# Patient Record
Sex: Male | Born: 1975
Health system: Southern US, Community
[De-identification: ages and names within clinical notes are randomized; demographics above are authoritative.]

## PROBLEM LIST (undated history)

## (undated) DIAGNOSIS — N529 Male erectile dysfunction, unspecified: Secondary | ICD-10-CM

## (undated) DIAGNOSIS — E291 Testicular hypofunction: Secondary | ICD-10-CM

## (undated) DIAGNOSIS — G4733 Obstructive sleep apnea (adult) (pediatric): Secondary | ICD-10-CM

## (undated) DIAGNOSIS — E88819 Insulin resistance, unspecified: Secondary | ICD-10-CM

## (undated) DIAGNOSIS — E8881 Metabolic syndrome: Secondary | ICD-10-CM

## (undated) DIAGNOSIS — I10 Essential (primary) hypertension: Secondary | ICD-10-CM

## (undated) DIAGNOSIS — L219 Seborrheic dermatitis, unspecified: Secondary | ICD-10-CM

## (undated) HISTORY — DX: Male erectile dysfunction, unspecified: N52.9

## (undated) HISTORY — DX: Insulin resistance, unspecified: E88.819

## (undated) HISTORY — DX: Morbid (severe) obesity due to excess calories: E66.01

## (undated) HISTORY — DX: Seborrheic dermatitis, unspecified: L21.9

## (undated) HISTORY — DX: Metabolic syndrome: E88.81

## (undated) HISTORY — DX: Essential (primary) hypertension: I10

## (undated) HISTORY — DX: Obstructive sleep apnea (adult) (pediatric): G47.33

## (undated) HISTORY — DX: Testicular hypofunction: E29.1

---

## 1999-01-28 HISTORY — PX: VASECTOMY: SHX75

## 1999-01-28 HISTORY — PX: TONSILLECTOMY: SUR1361

## 2003-01-28 HISTORY — PX: HERNIA REPAIR: SHX51

## 2008-08-21 ENCOUNTER — Emergency Department (HOSPITAL_COMMUNITY): Admission: EM | Admit: 2008-08-21 | Discharge: 2008-08-21 | Payer: Self-pay | Admitting: Emergency Medicine

## 2008-10-21 ENCOUNTER — Ambulatory Visit: Payer: Self-pay | Admitting: Occupational Medicine

## 2008-10-21 DIAGNOSIS — L738 Other specified follicular disorders: Secondary | ICD-10-CM | POA: Insufficient documentation

## 2008-10-21 DIAGNOSIS — I10 Essential (primary) hypertension: Secondary | ICD-10-CM

## 2009-02-13 ENCOUNTER — Ambulatory Visit: Payer: Self-pay | Admitting: Family Medicine

## 2009-03-26 ENCOUNTER — Ambulatory Visit (HOSPITAL_COMMUNITY): Admission: RE | Admit: 2009-03-26 | Discharge: 2009-03-26 | Payer: Self-pay | Admitting: Surgery

## 2009-03-30 ENCOUNTER — Ambulatory Visit (HOSPITAL_COMMUNITY): Admission: RE | Admit: 2009-03-30 | Discharge: 2009-03-30 | Payer: Self-pay | Admitting: Surgery

## 2009-05-07 ENCOUNTER — Encounter: Admission: RE | Admit: 2009-05-07 | Discharge: 2009-05-07 | Payer: Self-pay | Admitting: Surgery

## 2010-01-27 HISTORY — PX: LAPAROSCOPIC GASTRIC BANDING: SHX1100

## 2010-02-17 ENCOUNTER — Encounter: Payer: Self-pay | Admitting: Surgery

## 2010-02-26 NOTE — Assessment & Plan Note (Signed)
Summary: WGT MGT CLASS W/DR Aureliano Oshields/[facility]   Allergies: 1)  ! * Cats   Complete Medication List: 1)  Hyzaar 50-12.5 Mg Tabs (Losartan potassium-hctz) 2)  Keflex 500 Mg Caps (Cephalexin) .... One tablet three times a day for skin infection

## 2010-03-06 ENCOUNTER — Encounter: Payer: Self-pay | Admitting: *Deleted

## 2010-03-12 ENCOUNTER — Ambulatory Visit (HOSPITAL_BASED_OUTPATIENT_CLINIC_OR_DEPARTMENT_OTHER): Payer: 59 | Attending: Internal Medicine

## 2010-03-12 DIAGNOSIS — G4710 Hypersomnia, unspecified: Secondary | ICD-10-CM | POA: Insufficient documentation

## 2010-03-16 DIAGNOSIS — G4710 Hypersomnia, unspecified: Secondary | ICD-10-CM

## 2010-03-16 DIAGNOSIS — G4730 Sleep apnea, unspecified: Secondary | ICD-10-CM

## 2010-04-09 ENCOUNTER — Encounter: Payer: Self-pay | Admitting: Emergency Medicine

## 2010-04-09 ENCOUNTER — Ambulatory Visit (INDEPENDENT_AMBULATORY_CARE_PROVIDER_SITE_OTHER): Payer: 59 | Admitting: Emergency Medicine

## 2010-04-09 DIAGNOSIS — R599 Enlarged lymph nodes, unspecified: Secondary | ICD-10-CM | POA: Insufficient documentation

## 2010-04-16 NOTE — Assessment & Plan Note (Signed)
Summary: LUMP UNDER CHIN/TJ rm 4   Vital Signs:  Hector Tucker Profile:   35 Years Old Male CC:      lump under chin Height:     72 inches Weight:      340 pounds O2 Sat:      95 % O2 treatment:    Room Air Temp:     98.9 degrees F oral Pulse rate:   75 / minute Resp:     18 per minute BP sitting:   130 / 85  (left arm) Cuff size:   large  Vitals Entered By: Clemens Catholic LPN (April 09, 2010 5:11 PM)                  Updated Prior Medication List: HYZAAR 50-12.5 MG TABS (LOSARTAN POTASSIUM-HCTZ)   Current Allergies (reviewed today): ! ERYTHROMYCIN ! * CATSHistory of Present Illness History from: Hector Tucker Chief Complaint: lump under chin History of Present Illness: Lump under chin for 2 days.  May have cut himself on his razor a few days ago.  It is painful and swollen. No trouble breathing, no SOB.  No F/C, no URI symptoms.  No other concerns.  He is a Engineer, civil (consulting) at [employer].  REVIEW OF SYSTEMS Constitutional Symptoms      Denies fever, chills, night sweats, weight loss, weight gain, and fatigue.  Eyes       Denies change in vision, eye pain, eye discharge, glasses, contact lenses, and eye surgery. Ear/Nose/Throat/Mouth       Denies hearing loss/aids, change in hearing, ear pain, ear discharge, dizziness, frequent runny nose, frequent nose bleeds, sinus problems, sore throat, hoarseness, and tooth pain or bleeding.  Respiratory       Denies dry cough, productive cough, wheezing, shortness of breath, asthma, bronchitis, and emphysema/COPD.  Cardiovascular       Denies murmurs, chest pain, and tires easily with exhertion.    Gastrointestinal       Denies stomach pain, nausea/vomiting, diarrhea, constipation, blood in bowel movements, and indigestion. Genitourniary       Denies painful urination, kidney stones, and loss of urinary control. Neurological       Denies paralysis, seizures, and fainting/blackouts. Musculoskeletal       Denies muscle pain, joint pain, joint stiffness,  decreased range of motion, redness, swelling, muscle weakness, and gout.  Skin       Denies bruising, unusual mles/lumps or sores, and hair/skin or nail changes.  Psych       Denies mood changes, temper/anger issues, anxiety/stress, speech problems, depression, and sleep problems. Other Comments: pt states that he has had a painful lump under his chin for 2 days, worse today. he has not taken any OTC meds. no fever.   Past History:  Past Medical History: Reviewed history from 10/21/2008 and no changes required. Hypertension  Past Surgical History: Reviewed history from 10/21/2008 and no changes required. Tonsillectomy Vasectomy  Family History: Reviewed history from 10/21/2008 and no changes required. Family History Hypertension  Social History: Reviewed history from 10/21/2008 and no changes required. Occupation:Nurse Married Never Smoked Alcohol use-no Drug use-no Physical Exam General appearance: well developed, well nourished, no acute distress MSE: oriented to time, place, and person Submandibular tender lymphadenopathy.  No trauma or lacerations seen.  OP is clear and patent.  No apparent dental infections of sublingual swelling.  May also have nontender mild anterior cervical LAD as well.   Assessment New Problems: ENLARGEMENT OF LYMPH NODES (ICD-785.6)   Plan New Medications/Changes: AMOXICILLIN  875 MG TABS (AMOXICILLIN) 1 by mouth two times a day for 7 days  #14 x 0, 04/09/2010, Hoyt Koch MD  New Orders: Est. Hector Tucker Level III [phone] Planning Comments:   I think this is swelling of the submandibular, possibly from a razor cut and the lymph node is currently clearing the infection.  I doubt this is an abscess. OP is widely patent. No fever.  Will give Rx for Amox for 7 days just in case.  If not improving quickly, or if worsening, would get CBC and possibly an ultrasound and change the antibiotics for better coverage (Clindamycin vs Doxy). We will call  the Hector Tucker in 2 days for follow up.    The Hector Tucker and/or caregiver has been counseled thoroughly with regard to medications prescribed including dosage, schedule, interactions, rationale for use, and possible side effects and they verbalize understanding.  Diagnoses and expected course of recovery discussed and will return if not improved as expected or if the condition worsens. Hector Tucker and/or caregiver verbalized understanding.  Prescriptions: AMOXICILLIN 875 MG TABS (AMOXICILLIN) 1 by mouth two times a day for 7 days  #14 x 0   Entered and Authorized by:   Hoyt Koch MD   Signed by:   Hoyt Koch MD on 04/09/2010   Method used:   Print then Give to Hector Tucker   RxID:   [id number]   Orders Added: 1)  Est. Hector Tucker Level III [95621]

## 2010-04-19 ENCOUNTER — Telehealth (INDEPENDENT_AMBULATORY_CARE_PROVIDER_SITE_OTHER): Payer: Self-pay | Admitting: *Deleted

## 2010-04-25 NOTE — Progress Notes (Signed)
  Phone Note Outgoing Call Call back at [facility] Phone [phone]   Call placed by: Lajean Saver RN,  April 19, 2010 2:10 PM Call placed to: Patient Action Taken: Phone Call Completed Summary of Call: Callback: Patient reports his lump on his chin is improved. Encouraged to finis antibiotic.

## 2010-05-02 ENCOUNTER — Encounter: Payer: 59 | Attending: Surgery

## 2010-05-02 DIAGNOSIS — Z713 Dietary counseling and surveillance: Secondary | ICD-10-CM | POA: Insufficient documentation

## 2010-05-02 DIAGNOSIS — Z01818 Encounter for other preprocedural examination: Secondary | ICD-10-CM | POA: Insufficient documentation

## 2010-05-08 ENCOUNTER — Other Ambulatory Visit: Payer: Self-pay | Admitting: Surgery

## 2010-05-08 ENCOUNTER — Other Ambulatory Visit (HOSPITAL_COMMUNITY): Payer: Self-pay | Admitting: Surgery

## 2010-05-08 ENCOUNTER — Ambulatory Visit (HOSPITAL_COMMUNITY)
Admission: RE | Admit: 2010-05-08 | Discharge: 2010-05-08 | Disposition: A | Discharge status: Home / Self Care | Payer: 59 | Source: Ambulatory Visit | Attending: Surgery | Admitting: Surgery

## 2010-05-08 ENCOUNTER — Encounter (HOSPITAL_COMMUNITY): Payer: 59

## 2010-05-08 DIAGNOSIS — I10 Essential (primary) hypertension: Secondary | ICD-10-CM | POA: Insufficient documentation

## 2010-05-08 DIAGNOSIS — Z01812 Encounter for preprocedural laboratory examination: Secondary | ICD-10-CM | POA: Insufficient documentation

## 2010-05-08 DIAGNOSIS — E669 Obesity, unspecified: Secondary | ICD-10-CM | POA: Insufficient documentation

## 2010-05-08 DIAGNOSIS — Z01818 Encounter for other preprocedural examination: Secondary | ICD-10-CM | POA: Insufficient documentation

## 2010-05-08 LAB — DIFFERENTIAL
Basophils Relative: 0 % (ref 0–1)
Eosinophils Absolute: 0.1 10*3/uL (ref 0.0–0.7)
Lymphocytes Relative: 29 % (ref 12–46)
Lymphs Abs: 2.3 10*3/uL (ref 0.7–4.0)
Monocytes Absolute: 0.4 10*3/uL (ref 0.1–1.0)
Monocytes Relative: 6 % (ref 3–12)
Neutrophils Relative %: 63 % (ref 43–77)

## 2010-05-08 LAB — COMPREHENSIVE METABOLIC PANEL
ALT: 33 U/L (ref 0–53)
Alkaline Phosphatase: 72 U/L (ref 39–117)
BUN: 12 mg/dL (ref 6–23)
CO2: 27 mEq/L (ref 19–32)
Chloride: 102 mEq/L (ref 96–112)
Creatinine, Ser: 0.98 mg/dL (ref 0.4–1.5)
GFR calc Af Amer: >60 mL/min (ref >60)
Potassium: 4.1 mEq/L (ref 3.5–5.1)
Sodium: 138 mEq/L (ref 135–145)
Total Bilirubin: 0.9 mg/dL (ref 0.3–1.2)

## 2010-05-08 LAB — CBC
HCT: 42.5 % (ref 39.0–52.0)
Hemoglobin: 13.8 g/dL (ref 13.0–17.0)
MCH: 26.4 pg (ref 26.0–34.0)
MCHC: 32.5 g/dL (ref 30.0–36.0)

## 2010-05-08 LAB — SURGICAL PCR SCREEN: Staphylococcus aureus: POSITIVE — AB

## 2010-05-09 LAB — NO BLOOD PRODUCTS

## 2010-05-14 ENCOUNTER — Ambulatory Visit (HOSPITAL_COMMUNITY)
Admission: RE | Admit: 2010-05-14 | Discharge: 2010-05-15 | Disposition: A | Discharge status: Home / Self Care | Payer: 59 | Source: Ambulatory Visit | Attending: Surgery | Admitting: Surgery

## 2010-05-14 DIAGNOSIS — Z01810 Encounter for preprocedural cardiovascular examination: Secondary | ICD-10-CM | POA: Insufficient documentation

## 2010-05-14 DIAGNOSIS — Z79899 Other long term (current) drug therapy: Secondary | ICD-10-CM | POA: Insufficient documentation

## 2010-05-14 DIAGNOSIS — Z6841 Body Mass Index (BMI) 40.0 and over, adult: Secondary | ICD-10-CM | POA: Insufficient documentation

## 2010-05-14 DIAGNOSIS — G4733 Obstructive sleep apnea (adult) (pediatric): Secondary | ICD-10-CM | POA: Insufficient documentation

## 2010-05-14 DIAGNOSIS — I10 Essential (primary) hypertension: Secondary | ICD-10-CM | POA: Insufficient documentation

## 2010-05-14 DIAGNOSIS — K449 Diaphragmatic hernia without obstruction or gangrene: Secondary | ICD-10-CM | POA: Insufficient documentation

## 2010-05-14 DIAGNOSIS — Z01812 Encounter for preprocedural laboratory examination: Secondary | ICD-10-CM | POA: Insufficient documentation

## 2010-05-15 ENCOUNTER — Ambulatory Visit (HOSPITAL_COMMUNITY): Payer: 59

## 2010-05-15 LAB — CBC
Hemoglobin: 12 g/dL — ABNORMAL LOW (ref 13.0–17.0)
MCHC: 32.2 g/dL (ref 30.0–36.0)
MCV: 81.8 fL (ref 78.0–100.0)
Platelets: 250 10*3/uL (ref 150–400)
RBC: 4.56 MIL/uL (ref 4.22–5.81)

## 2010-05-15 LAB — DIFFERENTIAL
Basophils Absolute: 0 10*3/uL (ref 0.0–0.1)
Basophils Relative: 0 % (ref 0–1)

## 2010-05-21 NOTE — Op Note (Signed)
Hector Tucker, Hector Tucker             ACCOUNT NO.:  [id number]  MEDICAL RECORD NO.:  [id number]           PATIENT TYPE:  O  LOCATION:  DAYL                         FACILITY:  [facility]  PHYSICIAN:  Sandria Bales. Ezzard Standing, M.D.  DATE OF BIRTH:  [birth year]  DATE OF PROCEDURE: 14 May 2010                              OPERATIVE REPORT  PREOPERATIVE DIAGNOSES:  Morbid obesity (Weight 337, Body Mass Index of 42.6) and hiatal hernia.  POSTOPERATIVE DIAGNOSIS:  Morbid obesity (Weight 332, Body Mass Index of 42.6) and small-to-moderate hiatal hernia.  PROCEDURES:  Laparoscopic band placement with an APL band, hiatal hernia repair with two sutures in the posterior crura, and defatting of the esophagogastric junction.  SURGEON:  Sandria Bales. Ezzard Standing, M.D.  FIRST ASSISTANT:  Mary Sella. Andrey Campanile, M.D.  ANESTHESIA:  General endotracheal with approximately 30 cc of 0.25% Marcaine.  COMPLICATIONS:  None.  INDICATIONS FOR PROCEDURE:  Mr. Canion is a 35 year old black male who is a patient of Dr. Joycelyn Rua who has been overweight much of his adult life.  He has been through our bariatric program and is interested in a Lap-Band placement.  I have discussed with him the indications and potential complications of Lap-Band surgery.  The potential complications include, but are not limited to, bleeding, infection, bowel injury, slippage or erosion of the band and long-term nutritional consequences.  OPERATIVE NOTE:  The patient was placed in the supine position and underwent general anesthetic supervised by Dr. Ronelle Nigh.  He had 2 grams of Ancef at the initiation of the procedure.  His abdomen was prepped with ChloraPrep and sterilely draped.    A time-out was held and the surgical checklist run.  I accessed the abdominal cavity through the left upper quadrant with an 11-mm Ethicon trocar.  On entering into the abdominal cavity, the right and left lobes of the liver were unremarkable.  The stomach  was unremarkable.  The bowel that I could see was unremarkable.  He did have adhesions to an old ventral hernia repair.  I took down some of these adhesions, but I did not take them all down and could not assess the hernia repair.  I placed five additional trocars, a 5-mm subxiphoid trocar for a Nathanson retractor, a 15-mm right subcostal trocar for the port introducer, an 11-mm right paramedian trocar for dissection, an 11-mm left paramedian trocar for the camera and then a 5-mm trocar lateral left subcostal for a second hand from my first assistant.  I started first placing a Nathanson liver retractor under the left lobe of the liver.  I then identified the esophagogastric junction.  He did have a dimple in the esophago-gastric fat consistent with a small-to-moderate hiatal hernia.  I reduced this  fat out of the EG hiatus.  I was able to open along the gastroesophageal junction along the left crus at the angle of His.  I then opened up the gastrohepatic ligament.  I went ahead and identified the right crus.  At this point Anesthesia passed the Allergan sizing tube down and inflated 15 cc of air, pulled this back and the balloon slid into the esophagus, consistent  with a hiatal hernia.  I then spent time dissecting out in the retroesophageal junction, identifying the right and left crus.  He also had some fat, which was up in this hiatus, which I spent some time dissecting also.  I identified the right crus and the left crus and I used two 0 Ethibond sutures posteriorly with tie knots on the sutures.  I then repassed the sizing tube down and insufflated with 15 cc.  At this time, the balloon held up, showing that the hiatal hernia had been closed.  I then spent some time defatting a piece of omentum and fat off the gastroesophageal junction.  This block of fat was probably 4 or 5 cm long and 3 or 4 cm wide and I used the harmonic scalpel for this.  After this had been  defatted, I then could see the esophagogastric junction better in the proximal stomach.  I passed the finger dissector behind the esophagogastric junction.  Because of his size I used an AP large band that I then passed with using the finger to pull behind the esophagogastric junction.  He did have one vessel that came off from his lesser curvature of the stomach to the undersurface of the left lobe of the liver.  I tried to spare this and I tried to put the band above this vessel.  I then cinched the band down on the sizing tube.  The band seemed to fit well.  I then imbricated the stomach over the band laterally using three sutures of 0 Ethibond suture to imbricate the stomach.  After the completion of this imbrication, I then took a picture and placed this in the chart.  The patient also had asked for a video of his surgery, so I did copy or video shoot much of the surgery and we will give him a copy of this.  I then removed the [facility] retractor.  The Silastic tubing was brought out through the right abdominal incision.  I enlarged this.  I attached the Silastic tubing to the port which had a mesh backing.  I placed the pocket lateral to his incision.  I then closed these incisions.  I closed the larger port incision with 2-0 Vicryl sutures deep sutures and then each wound was closed with 5-0 Vicryl and the skin was painted with Dermabond.    The patient tolerated the procedure well and was transported to the recovery room in good condition.  Sponge and needle counts were correct at the end of the case.  Sandria Bales. Ezzard Standing, M.D., FACS   DHN/MEDQ  D:  05/14/2010  T:  05/14/2010  Job:  [id number]  cc:   Vilinda Flake, Ph.D. Fax: [phone]  Joycelyn Rua, M.D. Fax: [phone]  Electronically Signed by Ovidio Kin M.D. on 05/21/2010 11:11:56 AM

## 2010-05-28 ENCOUNTER — Ambulatory Visit: Payer: 59

## 2010-05-30 ENCOUNTER — Encounter: Payer: 59 | Attending: Surgery | Admitting: *Deleted

## 2010-05-30 DIAGNOSIS — Z01818 Encounter for other preprocedural examination: Secondary | ICD-10-CM | POA: Insufficient documentation

## 2010-05-30 DIAGNOSIS — Z713 Dietary counseling and surveillance: Secondary | ICD-10-CM | POA: Insufficient documentation

## 2010-06-11 ENCOUNTER — Ambulatory Visit: Payer: 59

## 2010-07-11 ENCOUNTER — Encounter: Payer: 59 | Attending: Surgery | Admitting: *Deleted

## 2010-07-11 DIAGNOSIS — Z713 Dietary counseling and surveillance: Secondary | ICD-10-CM | POA: Insufficient documentation

## 2010-07-11 DIAGNOSIS — Z01818 Encounter for other preprocedural examination: Secondary | ICD-10-CM | POA: Insufficient documentation

## 2010-07-17 ENCOUNTER — Encounter (INDEPENDENT_AMBULATORY_CARE_PROVIDER_SITE_OTHER): Payer: Self-pay | Admitting: Surgery

## 2010-09-03 ENCOUNTER — Encounter (INDEPENDENT_AMBULATORY_CARE_PROVIDER_SITE_OTHER): Payer: Self-pay

## 2010-09-06 ENCOUNTER — Encounter (INDEPENDENT_AMBULATORY_CARE_PROVIDER_SITE_OTHER): Payer: Self-pay | Admitting: Surgery

## 2010-09-19 ENCOUNTER — Encounter (INDEPENDENT_AMBULATORY_CARE_PROVIDER_SITE_OTHER): Payer: Self-pay | Admitting: Surgery

## 2010-09-20 ENCOUNTER — Encounter (INDEPENDENT_AMBULATORY_CARE_PROVIDER_SITE_OTHER): Payer: 59

## 2010-09-27 ENCOUNTER — Encounter (INDEPENDENT_AMBULATORY_CARE_PROVIDER_SITE_OTHER): Payer: 59

## 2010-10-11 ENCOUNTER — Encounter (INDEPENDENT_AMBULATORY_CARE_PROVIDER_SITE_OTHER): Payer: Self-pay | Admitting: Physician Assistant

## 2011-07-22 ENCOUNTER — Encounter: Payer: Self-pay | Admitting: Family Medicine

## 2011-07-22 ENCOUNTER — Ambulatory Visit (INDEPENDENT_AMBULATORY_CARE_PROVIDER_SITE_OTHER): Payer: 59 | Admitting: Family Medicine

## 2011-07-22 VITALS — BP 132/85 | HR 58 | Temp 97.6°F | Ht 72.0 in | Wt 343.0 lb

## 2011-07-22 DIAGNOSIS — I10 Essential (primary) hypertension: Secondary | ICD-10-CM

## 2011-07-22 DIAGNOSIS — E669 Obesity, unspecified: Secondary | ICD-10-CM

## 2011-07-22 LAB — TSH: TSH: 1.44 u[IU]/mL (ref 0.35–5.50)

## 2011-07-22 LAB — CBC WITH DIFFERENTIAL/PLATELET
Eosinophils Relative: 1.9 % (ref 0.0–5.0)
HCT: 41.2 % (ref 39.0–52.0)
Lymphocytes Relative: 30.1 % (ref 12.0–46.0)
MCHC: 31.6 g/dL (ref 30.0–36.0)
Monocytes Absolute: 0.4 10*3/uL (ref 0.1–1.0)
Neutro Abs: 4.7 10*3/uL (ref 1.4–7.7)
Neutrophils Relative %: 62.1 % (ref 43.0–77.0)
Platelets: 253 10*3/uL (ref 150.0–400.0)
RBC: 4.82 Mil/uL (ref 4.22–5.81)
RDW: 15.3 % — ABNORMAL HIGH (ref 11.5–14.6)

## 2011-07-22 LAB — COMPREHENSIVE METABOLIC PANEL
Albumin: 3.6 g/dL (ref 3.5–5.2)
Alkaline Phosphatase: 66 U/L (ref 39–117)
Calcium: 8.7 mg/dL (ref 8.4–10.5)
Creatinine, Ser: 1 mg/dL (ref 0.4–1.5)
Potassium: 3.9 mEq/L (ref 3.5–5.1)

## 2011-07-22 LAB — LIPID PANEL
Cholesterol: 172 mg/dL (ref 0–200)
LDL Cholesterol: 82 mg/dL (ref 0–99)
Total CHOL/HDL Ratio: 2
Triglycerides: 97 mg/dL (ref 0.0–149.0)
VLDL: 19.4 mg/dL (ref 0.0–40.0)

## 2011-07-22 MED ORDER — LOSARTAN POTASSIUM-HCTZ 50-12.5 MG PO TABS: 1.0000 | ORAL_TABLET | Freq: Every day | ORAL | Status: DC

## 2011-07-22 NOTE — Progress Notes (Signed)
Office Note 07/22/2011  CC:  Chief Complaint  Patient presents with  . Establish Care    refill BP med    HPI:  Hector Tucker is a 36 y.o. Black male who is here to establish care. Patient's most recent primary MD: Dr. Lenise Arena at [facility] in [place]. Old records were not reviewed prior to or during today's visit.  He feels well.  Past problems have been stable HTN, needs rf of med today.  Also morbid obesity.  He got lap band surgery in 2011 by Dr. Ezzard Standing in [facility] and he lost 40 lbs in 1st 75mo or so after this but then he stopped exercising and the weight has all come back.  He cites stress and time restrictions lately as factors in this problem, plans on getting back into good physical exercise regimen--has already started a little bit.  Past Medical History  Diagnosis Date  . Hypertension   . Obstructive sleep apnea     Past Surgical History  Procedure Date  . Vasectomy   . Hernia repair 2005  . Tonsillectomy   . Laparoscopic gastric banding 2012    with Hiatal hernia repair    Family History  Problem Relation Age of Onset  . Hypertension Mother   . Hypertension Father   . Alcohol abuse Maternal Grandmother     died of cirrhosis    History   Social History  . Marital Status: Married    Spouse Name: N/A    Number of Children: N/A  . Years of Education: N/A   Occupational History  . Not on file.   Social History Main Topics  . Smoking status: Never Smoker   . Smokeless tobacco: Never Used  . Alcohol Use: No  . Drug Use: No  . Sexually Active: Not on file   Other Topics Concern  . 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    Outpatient Encounter Prescriptions as of 07/22/2011  Medication Sig Dispense Refill  . losartan-hydrochlorothiazide (HYZAAR) 50-12.5 MG per tablet Take 1 tablet by  mouth daily.  90 tablet  2  . DISCONTD: losartan-hydrochlorothiazide (HYZAAR) 50-12.5 MG per tablet        . DISCONTD: cephALEXin (KEFLEX) 500 MG capsule Take 500 mg by mouth 3 (three) times daily. for skin infection         Allergies  Allergen Reactions  . Erythromycin     ROS Review of Systems  Constitutional: Negative for fever and fatigue.  HENT: Negative for congestion and sore throat.   Eyes: Negative for visual disturbance.  Respiratory: Negative for cough.   Cardiovascular: Negative for chest pain.  Gastrointestinal: Negative for nausea and abdominal pain.  Genitourinary: Negative for dysuria.  Musculoskeletal: Negative for back pain and joint swelling.  Skin: Negative for rash.  Neurological: Negative for weakness and headaches.  Hematological: Negative for adenopathy.    PE; Blood pressure 132/85, pulse 58, temperature 97.6 F (36.4 C), temperature source Temporal, height 6' (1.829 m), weight 343 lb (155.584 kg), SpO2 98.00%. Gen: Alert, well appearing, obese black male.  Patient is oriented to person, place, time, and situation. ENT: Eyes: no injection, icteris, swelling, or exudate.  EOMI, PERRLA. Nose: no drainage or turbinate edema/swelling.  No injection or focal lesion.  Mouth: lips without lesion/swelling.  Oral mucosa pink and moist.  Dentition intact and  without obvious caries or gingival swelling.  Oropharynx without erythema, exudate, or swelling.  Neck - No masses or thyromegaly or limitation in range of motion CV: RRR, no m/r/g.   LUNGS: CTA bilat, nonlabored resps, good aeration in all lung fields. ABD: soft, NT, ND, BS normal.  No hepatospenomegaly or mass.  No bruits. EXT: no clubbing, cyanosis, or edema.   Pertinent labs:  None today  ASSESSMENT AND PLAN:   New pt: obtain old records.  HYPERTENSION Problem stable.  Continue current medications and diet appropriate for this condition.  We have reviewed our general long term plan for this problem  and also reviewed symptoms and signs that should prompt the patient to call or return to the office. 90 day rx for med given. Will check routine labs: CBC, CMET, TSH, and FLP since it has been over a year since he has had these (he IS fasting today).    Return in about 6 months (around 01/21/2012) for f/u HTN and obesity.

## 2011-07-22 NOTE — Assessment & Plan Note (Signed)
Problem stable.  Continue current medications and diet appropriate for this condition.  We have reviewed our general long term plan for this problem and also reviewed symptoms and signs that should prompt the patient to call or return to the office. 90 day rx for med given. Will check routine labs: CBC, CMET, TSH, and FLP since it has been over a year since he has had these (he IS fasting today).

## 2011-07-30 ENCOUNTER — Encounter: Payer: Self-pay | Admitting: Family Medicine

## 2011-08-06 ENCOUNTER — Encounter: Payer: Self-pay | Admitting: Internal Medicine

## 2012-01-02 IMAGING — CR DG ABDOMEN 1V
1 series · 1 of 1 positions shown · non-contrast
Comparison: None

CLINICAL DATA: Postop gastric band procedure

ABDOMEN - 1 VIEW

[t abdomen supine]
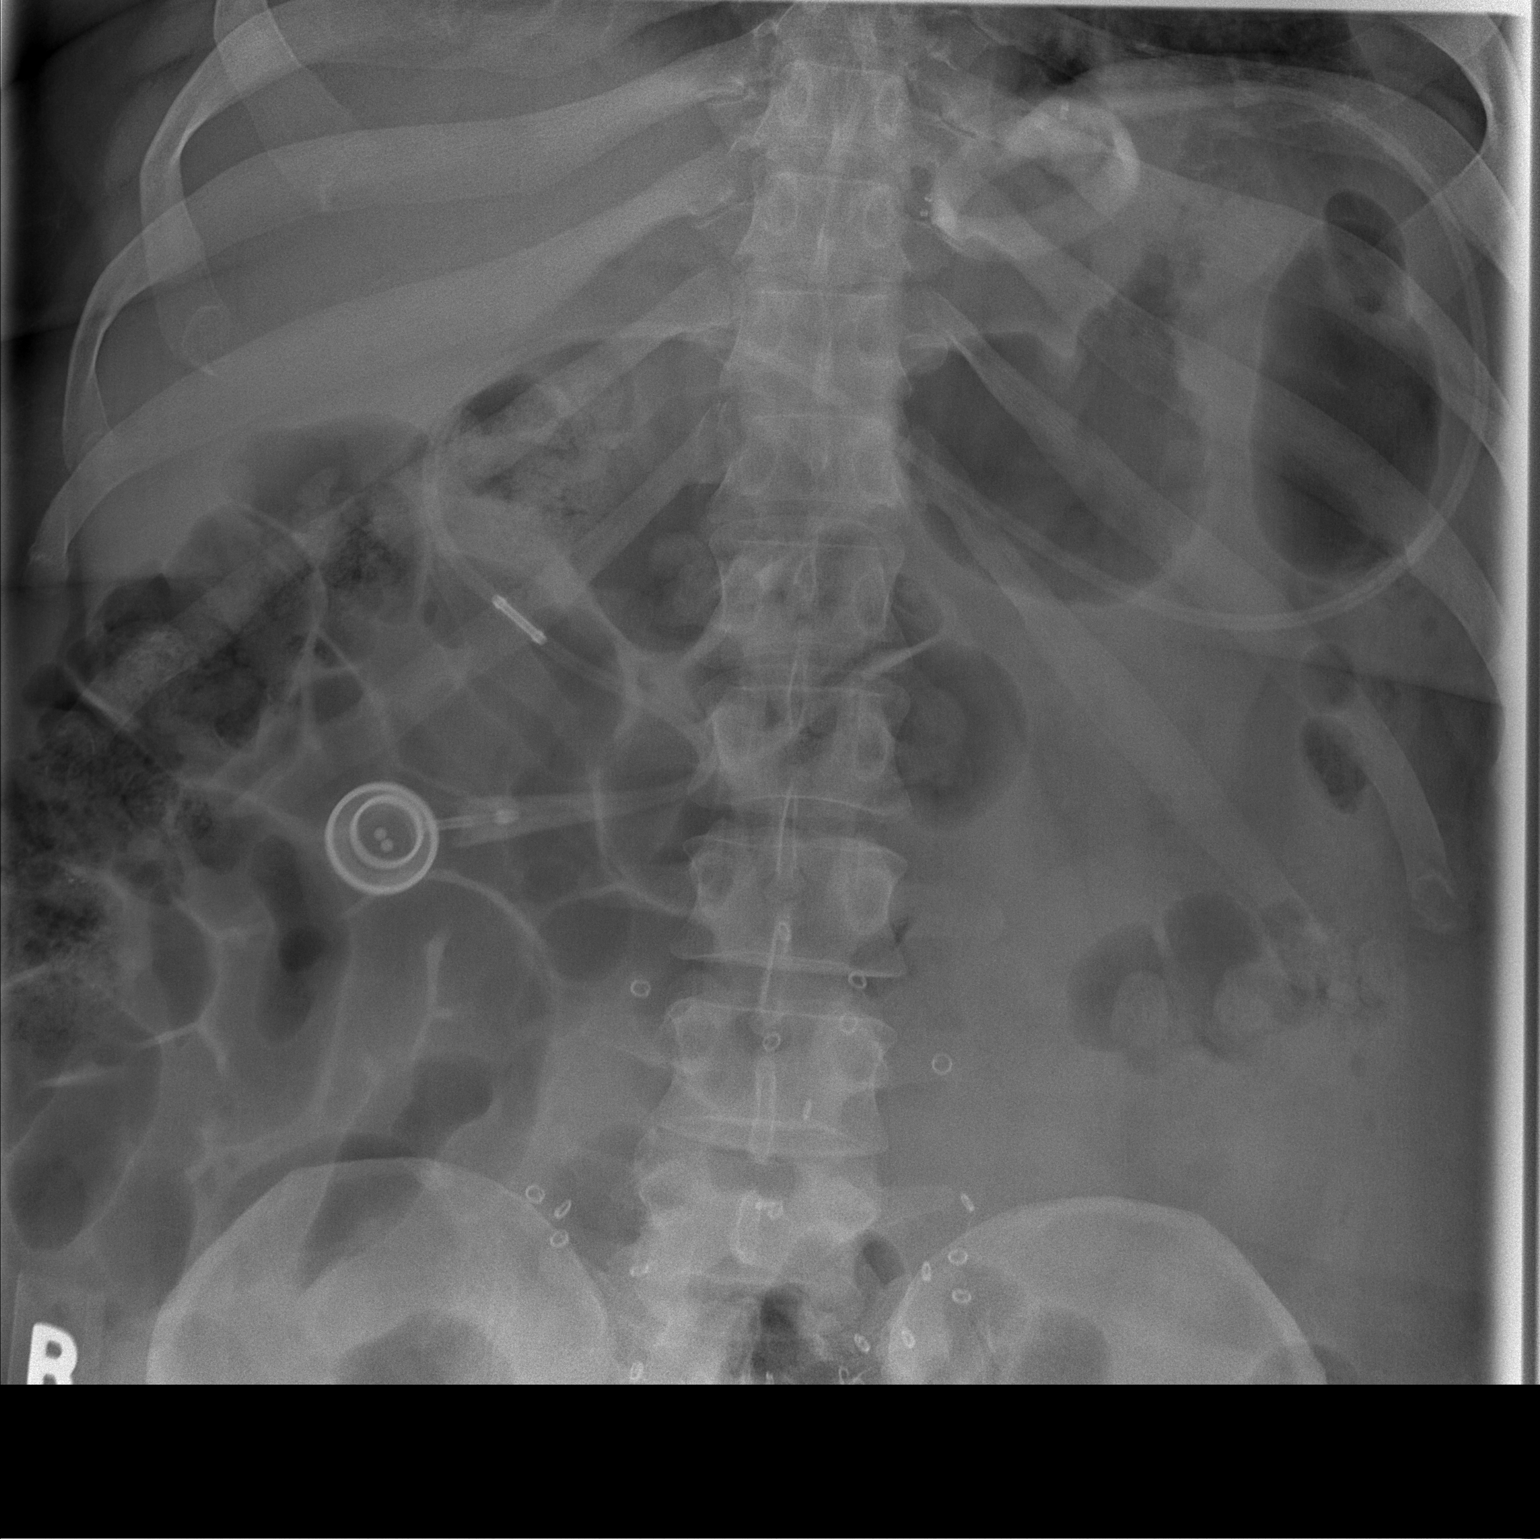

[1 of 1 positions shown; findings below may reference images not displayed]

FINDINGS: Gastric banding has been performed.  Gastric band is at
the 8 o'clock to 2 o'clock position and is in good position.
Tubing is continuous.

Mild ileus.  Prior ventral hernia repair with mesh.
IMPRESSION: Satisfactory gastric band placement.

## 2012-03-13 ENCOUNTER — Other Ambulatory Visit: Payer: Self-pay

## 2012-06-22 ENCOUNTER — Other Ambulatory Visit: Payer: Self-pay | Admitting: Family Medicine

## 2012-06-22 NOTE — Telephone Encounter (Signed)
Rx request to pharmacy; *30-day Only-Office Visit Needed Prior to Future Refills*/SLS

## 2012-07-26 ENCOUNTER — Encounter: Payer: Self-pay | Admitting: Family Medicine

## 2012-07-26 ENCOUNTER — Ambulatory Visit (INDEPENDENT_AMBULATORY_CARE_PROVIDER_SITE_OTHER): Payer: 59 | Admitting: Family Medicine

## 2012-07-26 VITALS — BP 135/85 | HR 60 | Temp 98.1°F | Resp 18 | Ht 74.0 in | Wt 351.0 lb

## 2012-07-26 DIAGNOSIS — R635 Abnormal weight gain: Secondary | ICD-10-CM

## 2012-07-26 DIAGNOSIS — R7301 Impaired fasting glucose: Secondary | ICD-10-CM

## 2012-07-26 DIAGNOSIS — I10 Essential (primary) hypertension: Secondary | ICD-10-CM

## 2012-07-26 LAB — COMPREHENSIVE METABOLIC PANEL
ALT: 32 U/L (ref 0–53)
AST: 26 U/L (ref 0–37)
Albumin: 3.8 g/dL (ref 3.5–5.2)
BUN: 16 mg/dL (ref 6–23)
CO2: 24 mEq/L (ref 19–32)
Chloride: 104 mEq/L (ref 96–112)
Creatinine, Ser: 1 mg/dL (ref 0.4–1.5)
Sodium: 137 mEq/L (ref 135–145)
Total Bilirubin: 0.5 mg/dL (ref 0.3–1.2)

## 2012-07-26 LAB — LIPID PANEL
HDL: 74 mg/dL (ref >39.00)
VLDL: 14.6 mg/dL (ref 0.0–40.0)

## 2012-07-26 MED ORDER — LOSARTAN POTASSIUM-HCTZ 50-12.5 MG PO TABS: ORAL_TABLET | ORAL | Status: DC

## 2012-07-26 NOTE — Assessment & Plan Note (Signed)
Problem stable.  Continue current medications and diet appropriate for this condition.  We have reviewed our general long term plan for this problem and also reviewed symptoms and signs that should prompt the patient to call or return to the office. Routine annual labs today: CMET, TSH, FLP.

## 2012-07-26 NOTE — Progress Notes (Signed)
OFFICE NOTE  07/26/2012  CC:  Chief Complaint  Patient presents with  . Medication Refill  . blood work     HPI: Patient is a 37 y.o. African-American male who is here for f/u HTN.  Took last bp pill today, reports good compliance, no side effects. Rare home bp checks reveal same as today's.  He admits he is not dieting or exercising at all.   Pertinent PMH:  Past Medical History  Diagnosis Date  . Hypertension   . Obstructive sleep apnea     CPAP 7 cm H2O; most recent sleep study and titration  was 02/2010 (Dr. Maple Hudson).  . Morbid obesity     MEDS:  Outpatient Prescriptions Prior to Visit  Medication Sig Dispense Refill  . losartan-hydrochlorothiazide (HYZAAR) 50-12.5 MG per tablet TAKE 1 TABLET BY MOUTH DAILY.  30 tablet  0   No facility-administered medications prior to visit.    PE: Blood pressure 135/85, pulse 60, temperature 98.1 F (36.7 C), temperature source Oral, resp. rate 18, height 6\' 2"  (1.88 m), weight 351 lb (159.213 kg), SpO2 97.00%. Gen: Alert, well appearing.  Patient is oriented to person, place, time, and situation. CV: RRR, no m/r/g.   LUNGS: CTA bilat, nonlabored resps, good aeration in all lung fields. EXT: no clubbing, cyanosis, or edema.    IMPRESSION AND PLAN:  HYPERTENSION Problem stable.  Continue current medications and diet appropriate for this condition.  We have reviewed our general long term plan for this problem and also reviewed symptoms and signs that should prompt the patient to call or return to the office. Routine annual labs today: CMET, TSH, FLP.  RF'd bp med today.  An After Visit Summary was printed and given to the patient.  FOLLOW UP: 6 mo

## 2012-07-27 NOTE — Addendum Note (Signed)
Addended by: Baldemar Lenis R on: 07/27/2012 03:40 PM   Modules accepted: Orders

## 2012-07-29 ENCOUNTER — Telehealth: Payer: Self-pay | Admitting: *Deleted

## 2012-07-29 NOTE — Telephone Encounter (Signed)
Message copied by Marlene Lard on Thu Jul 29, 2012  9:59 AM ------      Message from: Jeoffrey Massed      Created: Tue Jul 27, 2012  9:47 PM       Pls notify pt that his labs showed normal thyroid levels, normal cholesterol levels, and mildly elevated sugar.  His HbA1c, an additional measurement for diabetes screening, was 6.1%--this is prediabetic range.  It is of utmost importance that he start healthier lifestyle habits (diet, exercise, wt loss) in order to try to prevent this from progressing to diabetes in the future.  Nutritionist referral recommended.  Let me know if he wants this and I'll order it.-thx ------

## 2012-07-29 NOTE — Telephone Encounter (Signed)
Called patient with results. Patient was informed of results however he disconnected the call before I could inquire about the referral to Nutritionist. Called patient back and he disconnected the call again. Please advise?

## 2012-08-02 NOTE — Telephone Encounter (Signed)
Noted  

## 2012-08-02 NOTE — Telephone Encounter (Signed)
Patient hung up on me.

## 2012-08-02 NOTE — Telephone Encounter (Signed)
Do you mean he just hung up on you, or do you think the call got "dropped" for some reason? If he hung up on you, then I recommend doing nothing further. If you feel like there was an accidental disconnection then continue to try to finish the correspondence.-thx

## 2012-08-16 ENCOUNTER — Encounter: Payer: Self-pay | Admitting: Family Medicine

## 2012-08-26 ENCOUNTER — Ambulatory Visit (INDEPENDENT_AMBULATORY_CARE_PROVIDER_SITE_OTHER): Payer: 59 | Admitting: Family Medicine

## 2012-08-26 ENCOUNTER — Encounter: Payer: Self-pay | Admitting: Family Medicine

## 2012-08-26 VITALS — BP 137/83 | HR 65 | Temp 98.8°F | Resp 18 | Ht 75.0 in | Wt 353.0 lb

## 2012-08-26 DIAGNOSIS — L739 Follicular disorder, unspecified: Secondary | ICD-10-CM | POA: Insufficient documentation

## 2012-08-26 DIAGNOSIS — L738 Other specified follicular disorders: Secondary | ICD-10-CM

## 2012-08-26 MED ORDER — MOMETASONE FUROATE 0.1 % EX CREA: TOPICAL_CREAM | Freq: Every day | CUTANEOUS | Status: DC

## 2012-08-26 NOTE — Progress Notes (Signed)
OFFICE NOTE  08/26/2012  CC:  Chief Complaint  Patient presents with  . Rash     HPI: Patient is a 37 y.o. African-American male who is here for facial rash. Onset about 1 wk ago, hurts a little but no itching.  Washed face with a towel with cleaning substance on it inadvertently then the rash came out a day or two later.  He has never had this before.  Shaves with razor usually. Coco butter applied after onset- no help.  Some submandib region LN swelling recently per pt--after rash onset. He says he shaves his groin region but has no rash there. He has never had this type of rash before.  Pertinent PMH:  Past Medical History  Diagnosis Date  . Hypertension   . Obstructive sleep apnea     CPAP 7 cm H2O; most recent sleep study and titration  was 02/2010 (Dr. Maple Hudson).  . Morbid obesity    Past surgical, social, and family history reviewed and no changes noted since last office visit.  MEDS:  Outpatient Prescriptions Prior to Visit  Medication Sig Dispense Refill  . losartan-hydrochlorothiazide (HYZAAR) 50-12.5 MG per tablet TAKE 1 TABLET BY MOUTH DAILY.  90 tablet  2   No facility-administered medications prior to visit.    PE: Blood pressure 137/83, pulse 65, temperature 98.8 F (37.1 C), temperature source Temporal, resp. rate 18, height 6\' 3"  (1.905 m), weight 353 lb (160.12 kg), SpO2 95.00%. Gen: Alert, well appearing.  Patient is oriented to person, place, time, and situation. Area of face where a beard would grow has scattered, firm, papulopustular lesions without erythema or tenderness. No vesicular lesions.    IMPRESSION AND PLAN:  Folliculitis Chemical -induced folliculitis. Will do trial of low potency topical steroid--elocon 0.1% cream qd to affected areas. If not improved in 2 wks he'll call and I will order derm referral.   An After Visit Summary was printed and given to the patient.  FOLLOW UP: prn

## 2012-08-26 NOTE — Assessment & Plan Note (Signed)
Chemical -induced folliculitis. Will do trial of low potency topical steroid--elocon 0.1% cream qd to affected areas. If not improved in 2 wks he'll call and I will order derm referral.

## 2012-09-06 ENCOUNTER — Encounter: Payer: Self-pay | Admitting: Family Medicine

## 2012-12-02 ENCOUNTER — Other Ambulatory Visit: Payer: Self-pay

## 2012-12-29 ENCOUNTER — Telehealth: Payer: Self-pay | Admitting: Family Medicine

## 2012-12-29 NOTE — Telephone Encounter (Signed)
Patient feels like he is getting the flu. He has a scratchy throat & is coughing up yellow phloem. Can a zpack be sent to [facility] main campus Outpatient pharmacy?

## 2012-12-30 NOTE — Telephone Encounter (Signed)
Patient is aware that he needs appointment and that Dr. Milinda Cave is out of office until 01/03/13. Patient still wants to request rx if possible.

## 2013-01-03 NOTE — Telephone Encounter (Signed)
Left detailed message on patients cell phone.  Okay per DPR. 

## 2013-01-03 NOTE — Telephone Encounter (Signed)
Sorry.  Needs office visit.

## 2013-01-17 ENCOUNTER — Encounter: Payer: Self-pay | Admitting: Family Medicine

## 2013-01-17 ENCOUNTER — Ambulatory Visit (INDEPENDENT_AMBULATORY_CARE_PROVIDER_SITE_OTHER): Payer: 59 | Admitting: Family Medicine

## 2013-01-17 VITALS — BP 144/82 | HR 54 | Temp 98.4°F | Ht 75.0 in | Wt 359.0 lb

## 2013-01-17 DIAGNOSIS — Z202 Contact with and (suspected) exposure to infections with a predominantly sexual mode of transmission: Secondary | ICD-10-CM

## 2013-01-17 MED ORDER — METRONIDAZOLE 500 MG PO TABS: ORAL_TABLET | ORAL | Status: DC

## 2013-01-17 NOTE — Progress Notes (Signed)
Pre-visit discussion using our clinic review tool. No additional management support is needed unless otherwise documented below in the visit note.  

## 2013-01-17 NOTE — Assessment & Plan Note (Signed)
Metronidazole 2g single dose rx'd. Therapeutic expectations and side effect profile of medication discussed today.  Patient's questions answered.

## 2013-01-17 NOTE — Progress Notes (Signed)
OFFICE NOTE  01/17/2013  CC: No chief complaint on file.    HPI: Patient is a 37 y.o. African-American male who is here for tx for trich. Wife dx'd with trichomonas vaginalis last week.   He is asymptomatic.  Pertinent PMH:  No hx of STD.  MEDS:  Outpatient Prescriptions Prior to Visit  Medication Sig Dispense Refill  . losartan-hydrochlorothiazide (HYZAAR) 50-12.5 MG per tablet TAKE 1 TABLET BY MOUTH DAILY.  90 tablet  2  . mometasone (ELOCON) 0.1 % cream Apply topically daily. Apply once daily to your rash on your face  15 g  2   No facility-administered medications prior to visit.    PE: Blood pressure 144/82, pulse 54, temperature 98.4 F (36.9 C), temperature source Temporal, height 6\' 3"  (1.905 m), weight 359 lb (162.841 kg), SpO2 98.00%. Gen: Alert, well appearing.  Patient is oriented to person, place, time, and situation. AFFECT: pleasant, lucid thought and speech. No further exam today.  IMPRESSION AND PLAN:  Exposure to trichomonas Metronidazole 2g single dose rx'd. Therapeutic expectations and side effect profile of medication discussed today.  Patient's questions answered.    An After Visit Summary was printed and given to the patient.  FOLLOW UP: prn

## 2013-08-16 ENCOUNTER — Other Ambulatory Visit: Payer: Self-pay | Admitting: Family Medicine

## 2013-09-12 ENCOUNTER — Encounter: Payer: Self-pay | Admitting: Family Medicine

## 2013-09-12 ENCOUNTER — Ambulatory Visit (INDEPENDENT_AMBULATORY_CARE_PROVIDER_SITE_OTHER): Payer: 59 | Admitting: Family Medicine

## 2013-09-12 VITALS — BP 141/89 | HR 90 | Temp 98.9°F | Resp 18 | Ht 75.0 in | Wt 363.0 lb

## 2013-09-12 DIAGNOSIS — Z0000 Encounter for general adult medical examination without abnormal findings: Secondary | ICD-10-CM

## 2013-09-12 NOTE — Assessment & Plan Note (Addendum)
Reviewed age and gender appropriate health maintenance issues (prudent diet, regular exercise, health risks of tobacco and excessive alcohol, use of seatbelts, fire alarms in home, use of sunscreen).  Also reviewed age and gender appropriate health screening as well as vaccine recommendations. He will set up lab appt for fasting HP labs + Hba1c at his earliest convenience. Need to update Tdap--forgot to check with pt today.

## 2013-09-12 NOTE — Progress Notes (Signed)
Pre visit review using our clinic review tool, if applicable. No additional management support is needed unless otherwise documented below in the visit note. 

## 2013-09-12 NOTE — Progress Notes (Signed)
Office Note 09/12/2013  CC:  Chief Complaint  Patient presents with  . Annual Exam   HPI:  Hector Tucker is a 38 y.o. Black male who is here for CPE. Occ bp mon at home= 130s/80s.  Compliant with med daily. No acute complaints.  Due to stressors in life he feels like it has been difficult to get on a routine of diet/exercise with any regularity. He is not fasting today.  Past Medical History  Diagnosis Date  . Hypertension   . Obstructive sleep apnea     CPAP 7 cm H2O; most recent sleep study and titration  was 02/2010 (Dr. Maple HudsonYoung).  . Morbid obesity   . Insulin resistance     Past Surgical History  Procedure Laterality Date  . Vasectomy  2001  . Hernia repair  2005    Abdominal wall  . Tonsillectomy  2001  . Laparoscopic gastric banding  2012    with Hiatal hernia repair    Family History  Problem Relation Age of Onset  . Hypertension Mother   . Hypertension Father   . Alcohol abuse Maternal Grandmother     died of cirrhosis    History   Social History  . Marital Status: Married    Spouse Name: N/A    Number of Children: N/A  . Years of Education: N/A   Occupational History  . Not on file.   Social History Main Topics  . Smoking status: Never Smoker   . Smokeless tobacco: Never Used  . Alcohol Use: No  . Drug Use: No  . Sexual Activity: Not on file   Other Topics Concern  . Not on file   Social History Narrative   Married, 3 children in high school.   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   Orig from [place], [place].   No tob, occ alcohol, no drugs.   No regular exercise.    Outpatient Prescriptions Prior to Visit  Medication Sig Dispense Refill  . losartan-hydrochlorothiazide (HYZAAR) 50-12.5 MG per tablet TAKE 1 TABLET BY MOUTH DAILY.  90 tablet  2  . metroNIDAZOLE (FLAGYL) 500 MG tablet 4 tabs po qd x 1 dose  4 tablet  0  . mometasone (ELOCON) 0.1 % cream Apply topically daily. Apply once daily to your rash on  your face  15 g  2   No facility-administered medications prior to visit.    Allergies  Allergen Reactions  . Erythromycin   . Lotrel [Amlodipine Besy-Benazepril Hcl] Cough    ROS Review of Systems  Constitutional: Negative for fever, chills, appetite change and fatigue.  HENT: Negative for congestion, dental problem, ear pain and sore throat.   Eyes: Negative for discharge, redness and visual disturbance.  Respiratory: Negative for cough, chest tightness, shortness of breath and wheezing.   Cardiovascular: Negative for chest pain, palpitations and leg swelling.  Gastrointestinal: Negative for nausea, vomiting, abdominal pain, diarrhea and blood in stool.  Genitourinary: Negative for dysuria, urgency, frequency, hematuria, flank pain and difficulty urinating.  Musculoskeletal: Negative for arthralgias, back pain, joint swelling, myalgias and neck stiffness.  Skin: Negative for pallor and rash.  Neurological: Negative for dizziness, speech difficulty, weakness and headaches.  Hematological: Negative for adenopathy. Does not bruise/bleed easily.  Psychiatric/Behavioral: Negative for confusion and sleep disturbance. The patient is not nervous/anxious.     PE; Blood pressure 141/89, pulse 90, temperature 98.9 F (37.2 C), temperature source Temporal, resp. rate 18, height 6\' 3"  (1.905 m), weight 363  lb (164.656 kg), SpO2 97.00%. Gen: Alert, well appearing, obese-appearing.  Patient is oriented to person, place, time, and situation. AFFECT: pleasant, lucid thought and speech. ENT: Ears: EACs clear, normal epithelium.  TMs with good light reflex and landmarks bilaterally.  Eyes: no injection, icteris, swelling, or exudate.  EOMI, PERRLA. Nose: no drainage or turbinate edema/swelling.  No injection or focal lesion.  Mouth: lips without lesion/swelling.  Oral mucosa pink and moist.  Dentition intact and without obvious caries or gingival swelling.  Oropharynx without erythema, exudate, or  swelling.  Neck: supple/nontender.  No LAD, mass, or TM.  Carotid pulses 2+ bilaterally, without bruits. CV: RRR, no m/r/g.   LUNGS: CTA bilat, nonlabored resps, good aeration in all lung fields. ABD: soft, NT, ND, BS normal.  No hepatospenomegaly or mass.  No bruits. EXT: no clubbing, cyanosis, or edema.  Musculoskeletal: no joint swelling, erythema, warmth, or tenderness.  ROM of all joints intact. Skin - no sores or suspicious lesions or rashes or color changes   Pertinent labs:  None today Lab Results  Component Value Date   TSH 1.08 07/26/2012   Lab Results  Component Value Date   WBC 7.5 07/22/2011   HGB 13.0 07/22/2011   HCT 41.2 07/22/2011   MCV 85.6 07/22/2011   PLT 253.0 07/22/2011   Lab Results  Component Value Date   CREATININE 1.0 07/26/2012   BUN 16 07/26/2012   NA 137 07/26/2012   K 4.4 07/26/2012   CL 104 07/26/2012   CO2 24 07/26/2012   Lab Results  Component Value Date   ALT 32 07/26/2012   AST 26 07/26/2012   ALKPHOS 72 07/26/2012   BILITOT 0.5 07/26/2012   Lab Results  Component Value Date   CHOL 175 07/26/2012   Lab Results  Component Value Date   HDL 74.00 07/26/2012   Lab Results  Component Value Date   LDLCALC 86 07/26/2012   Lab Results  Component Value Date   TRIG 73.0 07/26/2012   Lab Results  Component Value Date   CHOLHDL 2 07/26/2012   No results found for this basename: PSA   Lab Results  Component Value Date   HGBA1C 6.1 07/26/2012    ASSESSMENT AND PLAN:   Health maintenance examination Reviewed age and gender appropriate health maintenance issues (prudent diet, regular exercise, health risks of tobacco and excessive alcohol, use of seatbelts, fire alarms in home, use of sunscreen).  Also reviewed age and gender appropriate health screening as well as vaccine recommendations. He will set up lab appt for fasting HP labs + Hba1c at his earliest convenience. Need to update Tdap--forgot to check with pt today.   An After Visit Summary  was printed and given to the patient.  FOLLOW UP:  Return in about 1 year (around 09/13/2014) for annual CPE with fasting labs the week prior.

## 2013-09-14 ENCOUNTER — Ambulatory Visit: Payer: 59 | Admitting: Family Medicine

## 2013-09-15 ENCOUNTER — Other Ambulatory Visit (INDEPENDENT_AMBULATORY_CARE_PROVIDER_SITE_OTHER): Payer: 59

## 2013-09-15 DIAGNOSIS — Z0000 Encounter for general adult medical examination without abnormal findings: Secondary | ICD-10-CM

## 2013-09-15 LAB — LIPID PANEL
CHOL/HDL RATIO: 3
CHOLESTEROL: 181 mg/dL (ref 0–200)
HDL: 66.2 mg/dL (ref >39.00)
LDL CALC: 100 mg/dL — AB (ref 0–99)
NonHDL: 114.8
TRIGLYCERIDES: 73 mg/dL (ref 0.0–149.0)
VLDL: 14.6 mg/dL (ref 0.0–40.0)

## 2013-09-15 LAB — CBC WITH DIFFERENTIAL/PLATELET
BASOS ABS: 0 10*3/uL (ref 0.0–0.1)
Basophils Relative: 0.4 % (ref 0.0–3.0)
Eosinophils Absolute: 0.2 10*3/uL (ref 0.0–0.7)
Eosinophils Relative: 2.1 % (ref 0.0–5.0)
HCT: 43 % (ref 39.0–52.0)
HEMOGLOBIN: 13.6 g/dL (ref 13.0–17.0)
LYMPHS ABS: 2.3 10*3/uL (ref 0.7–4.0)
Lymphocytes Relative: 29.6 % (ref 12.0–46.0)
MCHC: 31.5 g/dL (ref 30.0–36.0)
MCV: 84.1 fl (ref 78.0–100.0)
MONOS PCT: 5.2 % (ref 3.0–12.0)
Monocytes Absolute: 0.4 10*3/uL (ref 0.1–1.0)
Neutro Abs: 4.9 10*3/uL (ref 1.4–7.7)
Neutrophils Relative %: 62.7 % (ref 43.0–77.0)
PLATELETS: 273 10*3/uL (ref 150.0–400.0)
RBC: 5.12 Mil/uL (ref 4.22–5.81)
RDW: 16 % — AB (ref 11.5–15.5)
WBC: 7.8 10*3/uL (ref 4.0–10.5)

## 2013-09-15 LAB — COMPREHENSIVE METABOLIC PANEL
ALBUMIN: 3.7 g/dL (ref 3.5–5.2)
ALK PHOS: 77 U/L (ref 39–117)
ALT: 31 U/L (ref 0–53)
AST: 20 U/L (ref 0–37)
BUN: 11 mg/dL (ref 6–23)
CALCIUM: 9.3 mg/dL (ref 8.4–10.5)
CHLORIDE: 101 meq/L (ref 96–112)
CO2: 32 mEq/L (ref 19–32)
Creatinine, Ser: 1 mg/dL (ref 0.4–1.5)
GFR: 106.52 mL/min (ref >60.00)
Glucose, Bld: 90 mg/dL (ref 70–99)
Potassium: 4.8 mEq/L (ref 3.5–5.1)
Sodium: 138 mEq/L (ref 135–145)
Total Bilirubin: 0.6 mg/dL (ref 0.2–1.2)
Total Protein: 7.2 g/dL (ref 6.0–8.3)

## 2013-09-15 LAB — TSH: TSH: 1.24 u[IU]/mL (ref 0.35–4.50)

## 2013-09-15 LAB — HEMOGLOBIN A1C: Hgb A1c MFr Bld: 5.9 % (ref 4.6–6.5)

## 2014-02-06 ENCOUNTER — Emergency Department (HOSPITAL_BASED_OUTPATIENT_CLINIC_OR_DEPARTMENT_OTHER): Payer: 59

## 2014-02-06 ENCOUNTER — Emergency Department (HOSPITAL_BASED_OUTPATIENT_CLINIC_OR_DEPARTMENT_OTHER)
Admission: EM | Admit: 2014-02-06 | Discharge: 2014-02-06 | Disposition: A | Discharge status: Home / Self Care | Payer: 59 | Attending: Emergency Medicine | Admitting: Emergency Medicine

## 2014-02-06 ENCOUNTER — Telehealth: Payer: Self-pay | Admitting: Family Medicine

## 2014-02-06 ENCOUNTER — Encounter (HOSPITAL_BASED_OUTPATIENT_CLINIC_OR_DEPARTMENT_OTHER): Payer: Self-pay | Admitting: *Deleted

## 2014-02-06 DIAGNOSIS — G4733 Obstructive sleep apnea (adult) (pediatric): Secondary | ICD-10-CM | POA: Insufficient documentation

## 2014-02-06 DIAGNOSIS — Z9981 Dependence on supplemental oxygen: Secondary | ICD-10-CM | POA: Insufficient documentation

## 2014-02-06 DIAGNOSIS — J40 Bronchitis, not specified as acute or chronic: Secondary | ICD-10-CM

## 2014-02-06 DIAGNOSIS — I10 Essential (primary) hypertension: Secondary | ICD-10-CM | POA: Insufficient documentation

## 2014-02-06 DIAGNOSIS — J209 Acute bronchitis, unspecified: Secondary | ICD-10-CM | POA: Insufficient documentation

## 2014-02-06 DIAGNOSIS — J069 Acute upper respiratory infection, unspecified: Secondary | ICD-10-CM | POA: Diagnosis not present

## 2014-02-06 DIAGNOSIS — R509 Fever, unspecified: Secondary | ICD-10-CM

## 2014-02-06 DIAGNOSIS — J989 Respiratory disorder, unspecified: Secondary | ICD-10-CM

## 2014-02-06 DIAGNOSIS — Z79899 Other long term (current) drug therapy: Secondary | ICD-10-CM | POA: Diagnosis not present

## 2014-02-06 LAB — CBC WITH DIFFERENTIAL/PLATELET
Basophils Absolute: 0 10*3/uL (ref 0.0–0.1)
Basophils Relative: 0 % (ref 0–1)
EOS PCT: 1 % (ref 0–5)
Eosinophils Absolute: 0.2 10*3/uL (ref 0.0–0.7)
HEMATOCRIT: 40.9 % (ref 39.0–52.0)
HEMOGLOBIN: 13.1 g/dL (ref 13.0–17.0)
LYMPHS ABS: 1.8 10*3/uL (ref 0.7–4.0)
Lymphocytes Relative: 15 % (ref 12–46)
MCH: 26.3 pg (ref 26.0–34.0)
MCHC: 32 g/dL (ref 30.0–36.0)
MCV: 82 fL (ref 78.0–100.0)
Monocytes Absolute: 1.1 10*3/uL — ABNORMAL HIGH (ref 0.1–1.0)
Monocytes Relative: 9 % (ref 3–12)
NEUTROS ABS: 8.7 10*3/uL — AB (ref 1.7–7.7)
Neutrophils Relative %: 75 % (ref 43–77)
Platelets: 244 10*3/uL (ref 150–400)
RBC: 4.99 MIL/uL (ref 4.22–5.81)
RDW: 15.1 % (ref 11.5–15.5)
WBC: 11.8 10*3/uL — ABNORMAL HIGH (ref 4.0–10.5)

## 2014-02-06 LAB — COMPREHENSIVE METABOLIC PANEL
ALT: 22 U/L (ref 0–53)
AST: 20 U/L (ref 0–37)
Albumin: 3.8 g/dL (ref 3.5–5.2)
Alkaline Phosphatase: 74 U/L (ref 39–117)
Anion gap: 6 (ref 5–15)
BILIRUBIN TOTAL: 0.4 mg/dL (ref 0.3–1.2)
BUN: 10 mg/dL (ref 6–23)
CO2: 28 mmol/L (ref 19–32)
Calcium: 8.6 mg/dL (ref 8.4–10.5)
Chloride: 100 mEq/L (ref 96–112)
Creatinine, Ser: 1.05 mg/dL (ref 0.50–1.35)
GFR calc Af Amer: >90 mL/min (ref >90)
GFR calc non Af Amer: 88 mL/min — ABNORMAL LOW (ref >90)
GLUCOSE: 92 mg/dL (ref 70–99)
Potassium: 3.8 mmol/L (ref 3.5–5.1)
Sodium: 134 mmol/L — ABNORMAL LOW (ref 135–145)
Total Protein: 7.6 g/dL (ref 6.0–8.3)

## 2014-02-06 MED ORDER — AZITHROMYCIN 250 MG PO TABS: 250.0000 mg | ORAL_TABLET | Freq: Every day | ORAL | Status: DC

## 2014-02-06 MED ORDER — SODIUM CHLORIDE 0.9 % IV BOLUS (SEPSIS)
1000.0000 mL | Freq: Once | INTRAVENOUS | Status: AC
  Administered 2014-02-06: 1000 mL via INTRAVENOUS

## 2014-02-06 MED ORDER — ACETAMINOPHEN 325 MG PO TABS
650.0000 mg | ORAL_TABLET | Freq: Once | ORAL | Status: AC
  Administered 2014-02-06: 650 mg via ORAL
  Filled 2014-02-06: qty 2

## 2014-02-06 NOTE — Telephone Encounter (Signed)
[facility] - Client TELEPHONE ADVICE RECORD [facility]  Patient Name: Hector Tucker  Gender: Male  DOB: 1975-03-26   Age: 39 Y 14 D  Return Phone Number: [phone] (Primary)  Address: [address]   City[place]: [place] [address]   Client [facility] - Client  Client Site [facility]  Physician McGowen, Phil   Contact Type Call  Call Type Triage / Clinical     Relationship To Patient Self     Return Phone Number [phone] (Primary)  Chief Complaint Runny or Stuffy Nose  Initial Comment Caller states has body aches, runny nose, stuffy head; low grade temp last night; just back Sat from [place] and [place];      PreDisposition Call Doctor      Nurse Assessment  Nurse: Deloria LairHamilton, RN, Trish Date/Time Lamount Cohen(Eastern Time): 02/06/2014 12:00:06 PM  Confirm and document reason for call. If symptomatic, describe symptoms. ---Patient is calling for self caller states has body aches, runny nose, stuffy head; low grade temp last night; just back Sat from [place]; Temp has been 100.3-101 IBU being used. Cough since Sunday night. Clear nasal discharge and some blood. Has a H/A between eyes.  Has the patient traveled out of the country within the last 30 days? ---Yes  Where have you traveled? ([place] for Ebola and Ebola guideline, [place], [place] M[place]  Does the patient require triage? ---Yes  Related visit to physician within the last 2 weeks? ---No  Does the PT have any chronic conditions? (i.e. diabetes, asthma, etc.) ---Yes  List chronic conditions. ---high blood pressure and obestity     Guidelines      Guideline Title Affirmed Question Affirmed Notes Nurse Date/Time (Eastern Time)  Common Cold [1] Sinus pain (not just congestion) AND [2] fever  Deloria LairHamilton, RN, Trish 02/06/2014 12:04:16 PM   Disp. Time Lamount Cohen(Eastern Time)  Disposition Final User          02/06/2014 12:06:35 PM See Physician within 24 Hours Yes Deloria LairHamilton, RN, Scientist, product/process developmentTrish        Caller Understands: Yes  Disagree/Comply: Comply     Care Advice Given Per Guideline      SEE PHYSICIAN WITHIN 24 HOURS: FOR A STUFFY NOSE - USE NASAL WASHES: * STEP 1: Lean over a sink. * STEP 2: Gently squirt or spray warm salt water into one of your nostrils. * STEP 3: Some of the water may run into the back of your throat. Spit this out. If you swallow the salt water it will not hurt you. * STEP 4: Blow your nose to clean out the water and mucus. * STEP 5: Repeat steps 1-4 for the other nostril. You can do this a couple times a day if it seems to help you. * Nasal mucus and discharge help wash viruses and bacteria out of the nose and sinuses. FOR A RUNNY NOSE - BLOW YOUR NOSE: * Most cold medicines that are available over-the-counter (OTC) are not helpful. MEDICINES FOR STUFFY OR RUNNY NOSE: HUMIDIFIER: If the air in your home is dry, use a humidifier. * You become worse. * You have difficulty breathing * Fever over 104 F CALL BACK IF: CARE ADVICE given per Colds (Adult) guideline. * For muscle aches, headaches, or moderate fever (over 101 degrees F) (38.9 C) use acetaminophen every 4 hours. * Sore throat: throat lozenges, hard  candy or warm chicken broth. * Cough: use cough drops. * Hydrate: drink extra liquids.   After Care Instructions Given     Call Event Type User Date / Time Description        Referrals  REFERRED TO PCP OFFICE

## 2014-02-06 NOTE — ED Notes (Signed)
Rx x 1 given for zithrmax

## 2014-02-06 NOTE — Discharge Instructions (Signed)
Upper Respiratory Infection, Adult An upper respiratory infection (URI) is also sometimes known as the common cold. The upper respiratory tract includes the nose, sinuses, throat, trachea, and bronchi. Bronchi are the airways leading to the lungs. Most people improve within 1 week, but symptoms can last up to 2 weeks. A residual cough may last even longer.  CAUSES Many different viruses can infect the tissues lining the upper respiratory tract. The tissues become irritated and inflamed and often become very moist. Mucus production is also common. A cold is contagious. You can easily spread the virus to others by oral contact. This includes kissing, sharing a glass, coughing, or sneezing. Touching your mouth or nose and then touching a surface, which is then touched by another person, can also spread the virus. SYMPTOMS  Symptoms typically develop 1 to 3 days after you come in contact with a cold virus. Symptoms vary from person to person. They may include:  Runny nose.  Sneezing.  Nasal congestion.  Sinus irritation.  Sore throat.  Loss of voice (laryngitis).  Cough.  Fatigue.  Muscle aches.  Loss of appetite.  Headache.  Low-grade fever. DIAGNOSIS  You might diagnose your own cold based on familiar symptoms, since most people get a cold 2 to 3 times a year. Your caregiver can confirm this based on your exam. Most importantly, your caregiver can check that your symptoms are not due to another disease such as strep throat, sinusitis, pneumonia, asthma, or epiglottitis. Blood tests, throat tests, and X-rays are not necessary to diagnose a common cold, but they may sometimes be helpful in excluding other more serious diseases. Your caregiver will decide if any further tests are required. RISKS AND COMPLICATIONS  You may be at risk for a more severe case of the common cold if you smoke cigarettes, have chronic heart disease (such as heart failure) or lung disease (such as asthma), or if  you have a weakened immune system. The very young and very old are also at risk for more serious infections. Bacterial sinusitis, middle ear infections, and bacterial pneumonia can complicate the common cold. The common cold can worsen asthma and chronic obstructive pulmonary disease (COPD). Sometimes, these complications can require emergency medical care and may be life-threatening. PREVENTION  The best way to protect against getting a cold is to practice good hygiene. Avoid oral or hand contact with people with cold symptoms. Wash your hands often if contact occurs. There is no clear evidence that vitamin C, vitamin E, echinacea, or exercise reduces the chance of developing a cold. However, it is always recommended to get plenty of rest and practice good nutrition. TREATMENT  Treatment is directed at relieving symptoms. There is no cure. Antibiotics are not effective, because the infection is caused by a virus, not by bacteria. Treatment may include:  Increased fluid intake. Sports drinks offer valuable electrolytes, sugars, and fluids.  Breathing heated mist or steam (vaporizer or shower).  Eating chicken soup or other clear broths, and maintaining good nutrition.  Getting plenty of rest.  Using gargles or lozenges for comfort.  Controlling fevers with ibuprofen or acetaminophen as directed by your caregiver.  Increasing usage of your inhaler if you have asthma. Zinc gel and zinc lozenges, taken in the first 24 hours of the common cold, can shorten the duration and lessen the severity of symptoms. Pain medicines may help with fever, muscle aches, and throat pain. A variety of non-prescription medicines are available to treat congestion and runny nose. Your caregiver   can make recommendations and may suggest nasal or lung inhalers for other symptoms.  HOME CARE INSTRUCTIONS   Only take over-the-counter or prescription medicines for pain, discomfort, or fever as directed by your  caregiver.  Use a warm mist humidifier or inhale steam from a shower to increase air moisture. This may keep secretions moist and make it easier to breathe.  Drink enough water and fluids to keep your urine clear or pale yellow.  Rest as needed.  Return to work when your temperature has returned to normal or as your caregiver advises. You may need to stay home longer to avoid infecting others. You can also use a face mask and careful hand washing to prevent spread of the virus. SEEK MEDICAL CARE IF:   After the first few days, you feel you are getting worse rather than better.  You need your caregiver's advice about medicines to control symptoms.  You develop chills, worsening shortness of breath, or brown or red sputum. These may be signs of pneumonia.  You develop yellow or brown nasal discharge or pain in the face, especially when you bend forward. These may be signs of sinusitis.  You develop a fever, swollen neck glands, pain with swallowing, or white areas in the back of your throat. These may be signs of strep throat. SEEK IMMEDIATE MEDICAL CARE IF:   You have a fever.  You develop severe or persistent headache, ear pain, sinus pain, or chest pain.  You develop wheezing, a prolonged cough, cough up blood, or have a change in your usual mucus (if you have chronic lung disease).  You develop sore muscles or a stiff neck. Document Released: 07/09/2000 Document Revised: 04/07/2011 Document Reviewed: 04/20/2013 ExitCare Patient Information 2015 [employer]. This information is not intended to replace advice given to you by your health care provider. Make sure you discuss any questions you have with your health care provider.  

## 2014-02-06 NOTE — ED Provider Notes (Signed)
CSN: [id number]     Arrival date & time 02/06/14  1702 History   First MD Initiated Contact with Patient 02/06/14 1724     Chief Complaint  Patient presents with  . Fever  . Cough     Patient is a 39 y.o. male presenting with fever and cough. The history is provided by the patient. No language interpreter was used.  Fever Associated symptoms: cough   Cough Associated symptoms: fever    [name] presents for evaluation of fever and cough.  He reports developing cough and runny nose 3 days ago after returning from a trip to [place]. He started having fevers yesterday with body aches. He has a dry, nonproductive cough. He denies abdominal pain, vomiting. He denies any sick contacts. He did have multiple mosquito bites when he was in [place]. Symptoms are moderate and constant.  Past Medical History  Diagnosis Date  . Hypertension   . Obstructive sleep apnea     CPAP 7 cm H2O; most recent sleep study and titration  was 02/2010 (Dr. Maple Hudson).  . Morbid obesity   . Insulin resistance    Past Surgical History  Procedure Laterality Date  . Vasectomy  2001  . Hernia repair  2005    Abdominal wall  . Tonsillectomy  2001  . Laparoscopic gastric banding  2012    with Hiatal hernia repair   Family History  Problem Relation Age of Onset  . Hypertension Mother   . Hypertension Father   . Alcohol abuse Maternal Grandmother     died of cirrhosis   History  Substance Use Topics  . Smoking status: Never Smoker   . Smokeless tobacco: Never Used  . Alcohol Use: No    Review of Systems  Constitutional: Positive for fever.  Respiratory: Positive for cough.   All other systems reviewed and are negative.     Allergies  Erythromycin and Lotrel  Home Medications   Prior to Admission medications   Medication Sig Start Date End Date Taking? Authorizing Provider  losartan-hydrochlorothiazide (HYZAAR) 50-12.5 MG per tablet TAKE 1 TABLET BY MOUTH DAILY. 08/16/13   Jeoffrey Massed,  MD   BP 153/72 mmHg  Pulse 85  Temp(Src) 102.7 F (39.3 C) (Oral)  Resp 18  Ht 6' (1.829 m)  Wt 355 lb (161.027 kg)  BMI 48.14 kg/m2  SpO2 98% Physical Exam  Constitutional: He is oriented to person, place, and time. He appears well-developed and well-nourished.  HENT:  Head: Normocephalic and atraumatic.  Mouth/Throat: Oropharynx is clear and moist.  Cardiovascular: Normal rate and regular rhythm.   No murmur heard. Pulmonary/Chest: Effort normal and breath sounds normal. No respiratory distress.  Abdominal: Soft. There is no tenderness. There is no rebound and no guarding.  Musculoskeletal: He exhibits no edema or tenderness.  Neurological: He is alert and oriented to person, place, and time.  Skin: Skin is warm and dry.  Psychiatric: He has a normal mood and affect. His behavior is normal.  Nursing note and vitals reviewed.   ED Course  Procedures (including critical care time) Labs Review Labs Reviewed  COMPREHENSIVE METABOLIC PANEL - Abnormal; Notable for the following:    Sodium 134 (*)    GFR calc non Af Amer 88 (*)    All other components within normal limits  CBC WITH DIFFERENTIAL - Abnormal; Notable for the following:    WBC 11.8 (*)    Neutro Abs 8.7 (*)    Monocytes Absolute 1.1 (*)  All other components within normal limits    Imaging Review Dg Chest 2 View  02/06/2014   CLINICAL DATA:  Patient states he returned from [place] Saturday morning and since then he has had a cough and fever. Hx of htn and morbid obesity.  EXAM: CHEST  2 VIEW  COMPARISON:  05/08/2010  FINDINGS: The heart is enlarged. There are no focal consolidations or pleural effusions.  IMPRESSION: No active cardiopulmonary disease.   Electronically Signed   By: Rosalie GumsBeth  Brown M.D.   On: 02/06/2014 18:13     EKG Interpretation None      MDM   Final diagnoses:  Febrile respiratory illness  Bronchitis    Patient here for evaluation of fever and cough following recent travel. Patient  is nontoxic appearing without respiratory distress department. There is no clinical or radiographic evidence of pneumonia. Patient with likely bronchitis, unclear if viral versus bacterial in origin. Treating with Z-Pak, Tylenol or ibuprofen for fever control, discussed close return precautions for evidence of new or worrisome symptoms.    Tilden FossaElizabeth Manila Rommel, MD 02/06/14 2112

## 2014-02-06 NOTE — Telephone Encounter (Signed)
Pt scheduled appointment tomorrow.

## 2014-02-06 NOTE — ED Notes (Signed)
Returned from [place] 0300 Sat am- c/o cough and fever since Saturday evening

## 2014-02-07 ENCOUNTER — Encounter: Payer: 59 | Admitting: Family Medicine

## 2014-02-07 ENCOUNTER — Ambulatory Visit (INDEPENDENT_AMBULATORY_CARE_PROVIDER_SITE_OTHER): Payer: 59 | Admitting: Family Medicine

## 2014-02-07 ENCOUNTER — Encounter: Payer: Self-pay | Admitting: Family Medicine

## 2014-02-07 VITALS — BP 130/82 | Temp 98.6°F | Resp 18 | Ht 75.0 in | Wt 368.0 lb

## 2014-02-07 DIAGNOSIS — R509 Fever, unspecified: Principal | ICD-10-CM

## 2014-02-07 DIAGNOSIS — J989 Respiratory disorder, unspecified: Secondary | ICD-10-CM

## 2014-02-07 NOTE — Progress Notes (Signed)
OFFICE NOTE  02/07/2014  CC: fever, resp complaints. HPI: Patient is a 39 y.o. African-American male who is here for respiratory symptoms. Onset 4 d/a, nasal congestion, followed by fever, body aches, worsened congestion, coughing that was nonproductive.  No n/v/d or rash.  No abd pain. Temp went to 103 last night so he went to [facility] and CXR was normal and he was dx'd with febrile resp illness (? ILI), and rx'd Z-pack which he has not started yet.  Today he feels better: no fever, more energy, drinking lots of water. No OTC meds have been tried.  Pertinent PMH:  Past medical, surgical, social, and family history reviewed and no changes are noted since last office visit.  MEDS:  Outpatient Prescriptions Prior to Visit  Medication Sig Dispense Refill  . azithromycin (ZITHROMAX) 250 MG tablet Take 1 tablet (250 mg total) by mouth daily. Take first 2 tablets together, then 1 every day until finished. 6 tablet 0  . losartan-hydrochlorothiazide (HYZAAR) 50-12.5 MG per tablet TAKE 1 TABLET BY MOUTH DAILY. 90 tablet 2   No facility-administered medications prior to visit.    PE: Blood pressure 130/82, temperature 98.6 F (37 C), temperature source Temporal, resp. rate 18, height 6\' 3"  (1.905 m), weight 368 lb (166.924 kg), SpO2 96 %. VS: noted--normal. Gen: alert, NAD, NONTOXIC APPEARING. HEENT: eyes without injection, drainage, or swelling.  Ears: EACs clear, TMs with normal light reflex and landmarks.  Nose: Clear rhinorrhea, with some dried, crusty exudate adherent to mildly injected mucosa.  No purulent d/c.  No paranasal sinus TTP.  No facial swelling.  Throat and mouth without focal lesion.  No pharyngial swelling, erythema, or exudate.   Neck: supple, no LAD.   LUNGS: CTA bilat, nonlabored resps.   CV: RRR, no m/r/g. EXT: no c/c/e SKIN: no rash   IMPRESSION AND PLAN:  Febrile respiratory illness.   Improved today. I told him it was reasonable to hold off on starting  azith if he wanted to but was also reasonable to start z-pack as rx'd by EDP if he wanted to. Signs/symptoms to call or return for were reviewed and pt expressed understanding.  An After Visit Summary was printed and given to the patient.  FOLLOW UP: prn

## 2014-02-07 NOTE — Progress Notes (Signed)
Pre visit review using our clinic review tool, if applicable. No additional management support is needed unless otherwise documented below in the visit note. 

## 2014-08-07 ENCOUNTER — Other Ambulatory Visit: Payer: Self-pay | Admitting: Family Medicine

## 2014-08-07 NOTE — Telephone Encounter (Signed)
RF request for Losartan/hctz LOV: 02/07/14 acute visit, 09/12/13 CPE needs ov in after 09/13/14 for CPE Next ov: None Last written: 08/16/13 #90 w/ 2RF

## 2014-10-29 ENCOUNTER — Emergency Department
Admission: EM | Admit: 2014-10-29 | Discharge: 2014-10-29 | Disposition: A | Discharge status: Home / Self Care | Payer: 59 | Source: Home / Self Care | Attending: Family Medicine | Admitting: Family Medicine

## 2014-10-29 DIAGNOSIS — H0011 Chalazion right upper eyelid: Secondary | ICD-10-CM | POA: Diagnosis not present

## 2014-10-29 MED ORDER — SULFACETAMIDE SODIUM 10 % OP SOLN: 1.0000 [drp] | OPHTHALMIC | Status: DC

## 2014-10-29 NOTE — ED Notes (Signed)
Right eye swollen started 3 days ago.  Crusty when awakening, weepy during the day. No redness noted

## 2014-10-29 NOTE — ED Provider Notes (Signed)
CSN: [id number]     Arrival date & time 10/29/14  1122 History   First MD Initiated Contact with Patient 10/29/14 1237     Chief Complaint  Patient presents with  . Facial Swelling    rt eye      HPI Comments: Patient noted mild soreness/swelling in his right upper eyelid three days ago.  He has had some crusting on his right eyelids upon awakening, and increased lacrimation during the day.  He has had no changes in vision.  Patient is a 39 y.o. male presenting with eye problem. The history is provided by the patient.  Eye Problem Location:  R eye Quality:  Aching Severity:  Mild Onset quality:  Sudden Duration:  3 days Timing:  Constant Progression:  Unchanged Chronicity:  New Context: not contact lens problem, not direct trauma, not foreign body, not scratch and not smoke exposure   Relieved by:  None tried Worsened by:  Nothing tried Ineffective treatments:  None tried Associated symptoms: crusting, discharge, inflammation, swelling and tearing   Associated symptoms: no blurred vision, no decreased vision, no double vision, no facial rash, no foreign body sensation, no headaches, no itching, no photophobia, no redness and no scotomas   Risk factors: no recent herpes zoster and no recent URI     Past Medical History  Diagnosis Date  . Hypertension   . Obstructive sleep apnea     CPAP 7 cm H2O; most recent sleep study and titration  was 02/2010 (Dr. Maple Hudson).  . Morbid obesity (HCC)   . Insulin resistance    Past Surgical History  Procedure Laterality Date  . Vasectomy  2001  . Hernia repair  2005    Abdominal wall  . Tonsillectomy  2001  . Laparoscopic gastric banding  2012    with Hiatal hernia repair   Family History  Problem Relation Age of Onset  . Hypertension Mother   . Hypertension Father   . Alcohol abuse Maternal Grandmother     died of cirrhosis   Social History  Substance Use Topics  . Smoking status: Never Smoker   . Smokeless tobacco: Never Used  .  Alcohol Use: Yes     Comment: 2-3/week    Review of Systems  Eyes: Positive for discharge. Negative for blurred vision, double vision, photophobia, redness and itching.  Neurological: Negative for headaches.    Allergies  Erythromycin and Lotrel  Home Medications   Prior to Admission medications   Medication Sig Start Date End Date Taking? Authorizing Provider  losartan-hydrochlorothiazide (HYZAAR) 50-12.5 MG per tablet TAKE 1 TABLET BY MOUTH DAILY. 08/07/14   Jeoffrey Massed, MD  sulfacetamide (BLEPH-10) 10 % ophthalmic solution Place 1-2 drops into the right eye every 3 (three) hours. 10/29/14   Lattie Haw, MD   Meds Ordered and Administered this Visit  Medications - No data to display  BP 138/85 mmHg  Pulse 57  Temp(Src) 98.6 F (37 C) (Oral)  Ht  (1.88 m)  Wt   SpO2 97% No data found.   Physical Exam  Constitutional: He appears well-developed and well-nourished. No distress.  HENT:  Head: Normocephalic and atraumatic.  Right Ear: External ear normal.  Left Ear: External ear normal.  Nose: Nose normal.  Mouth/Throat: Oropharynx is clear and moist.  Eyes: Conjunctivae and EOM are normal. Pupils are equal, round, and reactive to light. Lids are everted and swept, no foreign bodies found. Right eye exhibits no chemosis and no discharge. No foreign  body present in the right eye. Left eye exhibits no discharge.    Right upper eyelid mildly swollen and tender to palpation.  Fluorescein negative uptake.  Neck: Neck supple.  Lymphadenopathy:    He has no cervical adenopathy.  Nursing note and vitals reviewed.   ED Course  Procedures  None    MDM   1. Chalazion of right upper eyelid    Begin applying warm compress several times daily.  Begin sulfacetamide ophthalmic susp. Followup with ophthalmologist if not improving one week.   Lattie Haw, MD 11/06/14 [phone]

## 2014-10-29 NOTE — Discharge Instructions (Signed)
Chalazion A chalazion is a swelling or hard lump on the eyelid caused by a blocked oil gland. Chalazions may occur on the upper or the lower eyelid.  CAUSES  Oil gland in the eyelid becomes blocked. SYMPTOMS   Swelling or hard lump on the eyelid. This lump may make it hard to see out of the eye.  The swelling may spread to areas around the eye. TREATMENT   Although some chalazions disappear by themselves in 1 or 2 months, some chalazions may need to be removed.  Medicines to treat an infection may be required. HOME CARE INSTRUCTIONS   Wash your hands often and dry them with a clean towel. Do not touch the chalazion.  Apply heat to the eyelid several times a day for 10 minutes to help ease discomfort and bring any yellowish white fluid (pus) to the surface. One way to apply heat to a chalazion is to use the handle of a metal spoon.  Hold the handle under hot water until it is hot, and then wrap the handle in paper towels so that the heat can come through without burning your skin.  Hold the wrapped handle against the chalazion and reheat the spoon handle as needed.  Apply heat in this fashion for 10 minutes, 4 times per day.  Return to your caregiver to have the pus removed if it does not break (rupture) on its own.  Do not try to remove the pus yourself by squeezing the chalazion or sticking it with a pin or needle.  Only take over-the-counter or prescription medicines for pain, discomfort, or fever as directed by your caregiver. SEEK IMMEDIATE MEDICAL CARE IF:   You have pain in your eye.  Your vision changes.  The chalazion does not go away.  The chalazion becomes painful, red, or swollen, grows larger, or does not start to disappear after 2 weeks. MAKE SURE YOU:   Understand these instructions.  Will watch your condition.  Will get help right away if you are not doing well or get worse. Document Released: 01/11/2000 Document Revised: 04/07/2011 Document Reviewed:  04/30/2009 Exit[employer] Patient Information 2015 [employer]. This information is not intended to replace advice given to you by your health care provider. Make sure you discuss any questions you have with your health care provider.  

## 2015-01-12 ENCOUNTER — Ambulatory Visit (INDEPENDENT_AMBULATORY_CARE_PROVIDER_SITE_OTHER): Payer: 59 | Admitting: Family Medicine

## 2015-01-12 ENCOUNTER — Encounter: Payer: Self-pay | Admitting: Family Medicine

## 2015-01-12 VITALS — BP 135/84 | HR 63 | Temp 98.4°F | Resp 16 | Ht 75.0 in | Wt 379.0 lb

## 2015-01-12 DIAGNOSIS — E8881 Metabolic syndrome: Secondary | ICD-10-CM

## 2015-01-12 DIAGNOSIS — I10 Essential (primary) hypertension: Secondary | ICD-10-CM | POA: Diagnosis not present

## 2015-01-12 DIAGNOSIS — E88819 Insulin resistance, unspecified: Secondary | ICD-10-CM

## 2015-01-12 LAB — COMPREHENSIVE METABOLIC PANEL
ALK PHOS: 69 U/L (ref 39–117)
ALT: 26 U/L (ref 0–53)
AST: 18 U/L (ref 0–37)
Albumin: 3.8 g/dL (ref 3.5–5.2)
BUN: 17 mg/dL (ref 6–23)
CALCIUM: 9.1 mg/dL (ref 8.4–10.5)
CO2: 29 mEq/L (ref 19–32)
Chloride: 105 mEq/L (ref 96–112)
Creatinine, Ser: 0.95 mg/dL (ref 0.40–1.50)
GFR: 113.52 mL/min (ref >60.00)
Glucose, Bld: 95 mg/dL (ref 70–99)
POTASSIUM: 4.3 meq/L (ref 3.5–5.1)
Sodium: 140 mEq/L (ref 135–145)
TOTAL PROTEIN: 7.1 g/dL (ref 6.0–8.3)
Total Bilirubin: 0.5 mg/dL (ref 0.2–1.2)

## 2015-01-12 LAB — LIPID PANEL
CHOL/HDL RATIO: 3
Cholesterol: 172 mg/dL (ref 0–200)
HDL: 62 mg/dL (ref >39.00)
LDL Cholesterol: 97 mg/dL (ref 0–99)
NONHDL: 110.38
TRIGLYCERIDES: 66 mg/dL (ref 0.0–149.0)
VLDL: 13.2 mg/dL (ref 0.0–40.0)

## 2015-01-12 LAB — HEMOGLOBIN A1C: HEMOGLOBIN A1C: 6 % (ref 4.6–6.5)

## 2015-01-12 MED ORDER — LOSARTAN POTASSIUM-HCTZ 50-12.5 MG PO TABS: 1.0000 | ORAL_TABLET | Freq: Every day | ORAL | Status: DC

## 2015-01-12 NOTE — Progress Notes (Signed)
OFFICE VISIT  01/12/2015   CC:  Chief Complaint  Patient presents with  . Follow-up    HTN. Pt is fasting.    HPI:    Patient is a 39 y.o. African-American male who presents for f/u HTN and insulin resistance. Home bp monitoring shows them to be normal consistently. No dieting lately.  Not exercising any either.  Past Medical History  Diagnosis Date  . Hypertension   . Obstructive sleep apnea     CPAP 7 cm H2O; most recent sleep study and titration  was 02/2010 (Dr. Maple HudsonYoung).  . Morbid obesity (HCC)   . Insulin resistance     Past Surgical History  Procedure Laterality Date  . Vasectomy  2001  . Hernia repair  2005    Abdominal wall  . Tonsillectomy  2001  . Laparoscopic gastric banding  2012    with Hiatal hernia repair    Outpatient Prescriptions Prior to Visit  Medication Sig Dispense Refill  . losartan-hydrochlorothiazide (HYZAAR) 50-12.5 MG per tablet TAKE 1 TABLET BY MOUTH DAILY. 90 tablet 0  . sulfacetamide (BLEPH-10) 10 % ophthalmic solution Place 1-2 drops into the right eye every 3 (three) hours. (Patient not taking: Reported on 01/12/2015) 5 mL 0   No facility-administered medications prior to visit.    Allergies  Allergen Reactions  . Erythromycin   . Lotrel [Amlodipine Besy-Benazepril Hcl] Cough    ROS As per HPI  PE: Blood pressure 135/84, pulse 63, temperature 98.4 F (36.9 C), temperature source Oral, resp. rate 16, height 6\' 3"  (1.905 m), weight 379 lb (171.913 kg), SpO2 97 %. BMI 47 Gen: Alert, well appearing.  Patient is oriented to person, place, time, and situation. CV: RRR, distant S1 and S2, no m/r/g.   LUNGS: CTA bilat, nonlabored resps, good aeration in all lung fields. EXT: no clubbing, cyanosis, or edema.    LABS:  Lab Results  Component Value Date   HGBA1C 5.9 09/15/2013     Chemistry      Component Value Date/Time   NA 134* 02/06/2014 1755   K 3.8 02/06/2014 1755   CL 100 02/06/2014 1755   CO2 28 02/06/2014 1755   BUN  10 02/06/2014 1755   CREATININE 1.05 02/06/2014 1755      Component Value Date/Time   CALCIUM 8.6 02/06/2014 1755   ALKPHOS 74 02/06/2014 1755   AST 20 02/06/2014 1755   ALT 22 02/06/2014 1755   BILITOT 0.4 02/06/2014 1755     Lab Results  Component Value Date   CHOL 181 09/15/2013   HDL 66.20 09/15/2013   LDLCALC 100* 09/15/2013   TRIG 73.0 09/15/2013   CHOLHDL 3 09/15/2013    IMPRESSION AND PLAN:  1) HTN; The current medical regimen is effective;  continue present plan and medications. Lytes/cr today.  2) Insulin resistance: encouraged TLC. Check A1c and FLP today.  An After Visit Summary was printed and given to the patient.  FOLLOW UP: Return in about 6 months (around 07/13/2015) for annual CPE (fasting).

## 2015-01-12 NOTE — Progress Notes (Signed)
Pre visit review using our clinic review tool, if applicable. No additional management support is needed unless otherwise documented below in the visit note. 

## 2015-01-28 DIAGNOSIS — L219 Seborrheic dermatitis, unspecified: Secondary | ICD-10-CM

## 2015-01-28 HISTORY — DX: Seborrheic dermatitis, unspecified: L21.9

## 2015-02-19 ENCOUNTER — Encounter: Payer: Self-pay | Admitting: Family Medicine

## 2015-02-19 NOTE — Telephone Encounter (Signed)
Please advise. Thanks.  

## 2015-02-19 NOTE — Telephone Encounter (Signed)
Appt with me should be fine.

## 2015-02-19 NOTE — Telephone Encounter (Signed)
Pt advised and voiced understanding.  Apt made for 02/26/15 at 11:30am.

## 2015-02-26 ENCOUNTER — Encounter: Payer: Self-pay | Admitting: Family Medicine

## 2015-02-26 ENCOUNTER — Ambulatory Visit (INDEPENDENT_AMBULATORY_CARE_PROVIDER_SITE_OTHER): Payer: 59 | Admitting: Family Medicine

## 2015-02-26 VITALS — BP 136/89 | HR 66 | Temp 98.6°F | Resp 16 | Ht 75.0 in | Wt 380.0 lb

## 2015-02-26 DIAGNOSIS — L219 Seborrheic dermatitis, unspecified: Secondary | ICD-10-CM

## 2015-02-26 MED ORDER — KETOCONAZOLE 2 % EX CREA: 1.0000 "application " | TOPICAL_CREAM | Freq: Every day | CUTANEOUS | Status: DC

## 2015-02-26 MED FILL — KETOCONAZOLE 2% CREAM: 2 | 30 days supply | Qty: 30 | Fill #0

## 2015-02-26 NOTE — Progress Notes (Signed)
OFFICE VISIT  02/26/2015   CC:  Chief Complaint  Patient presents with  . Eczema    dry skin x 2-3 months   HPI:    Patient is a 40 y.o. African-American male who presents for dryness of several areas of scalp, face, and neck. Flakiness noted, not much itching unless extremely dry/flaky.  Worse in winter.  Uses head and shoulders and puts this on the areas of skin as well.  Also tried eucerin shampoo. No other areas of the skin involved.  Past Medical History  Diagnosis Date  . Hypertension   . Obstructive sleep apnea     CPAP 7 cm H2O; most recent sleep study and titration  was 02/2010 (Dr. Maple Hudson).  . Morbid obesity (HCC)   . Insulin resistance     Past Surgical History  Procedure Laterality Date  . Vasectomy  2001  . Hernia repair  2005    Abdominal wall  . Tonsillectomy  2001  . Laparoscopic gastric banding  2012    with Hiatal hernia repair    Outpatient Prescriptions Prior to Visit  Medication Sig Dispense Refill  . losartan-hydrochlorothiazide (HYZAAR) 50-12.5 MG tablet Take 1 tablet by mouth daily. 90 tablet 3   No facility-administered medications prior to visit.    Allergies  Allergen Reactions  . Erythromycin   . Lotrel [Amlodipine Besy-Benazepril Hcl] Cough    ROS As per HPI  PE: Blood pressure 136/89, pulse 66, temperature 98.6 F (37 C), temperature source Oral, resp. rate 16, height  (1.905 m), weight 380 lb (172.367 kg), SpO2 97 %. Gen: Alert, well appearing.  Patient is oriented to person, place, time, and situation. Scalp w/out lesion. Both retroauricular areas with pinkish macular rash w/out flaking/desquamation.  Similar linear shaped rash in anterior lower neck line.  Eye brows with mild hypopigmentation beneath the hair but no flaking.  R temple and forehead with oval pinkish lesions with slightly raised borders and slight central clearing.   LABS:  Lab Results  Component Value Date   TSH 1.24 09/15/2013     Chemistry       Component Value Date/Time   NA 140 01/12/2015 0838   K 4.3 01/12/2015 0838   CL 105 01/12/2015 0838   CO2 29 01/12/2015 0838   BUN 17 01/12/2015 0838   CREATININE 0.95 01/12/2015 0838      Component Value Date/Time   CALCIUM 9.1 01/12/2015 0838   ALKPHOS 69 01/12/2015 0838   AST 18 01/12/2015 0838   ALT 26 01/12/2015 0838   BILITOT 0.5 01/12/2015 0838     Lab Results  Component Value Date   CHOL 172 01/12/2015   HDL 62.00 01/12/2015   LDLCALC 97 01/12/2015   TRIG 66.0 01/12/2015   CHOLHDL 3 01/12/2015   Lab Results  Component Value Date   WBC 11.8* 02/06/2014   HGB 13.1 02/06/2014   HCT 40.9 02/06/2014   MCV 82.0 02/06/2014   PLT 244 02/06/2014   Lab Results  Component Value Date   HGBA1C 6.0 01/12/2015    IMPRESSION AND PLAN:  I believe he has seborrheic dermatitis. Trial of ketoconazole 2% cream qd. Trial of otc strength hydrocortisone ointment bid prn to areas. He'll e-mail or call with update in about 2 wks.  An After Visit Summary was printed and given to the patient.  FOLLOW UP: No Follow-up on file.

## 2015-02-26 NOTE — Progress Notes (Signed)
Pre visit review using our clinic review tool, if applicable. No additional management support is needed unless otherwise documented below in the visit note. 

## 2015-02-26 NOTE — Patient Instructions (Signed)
Buy otc generic hydrocortisone ointment and apply to affected areas 1-2 times per day as needed.

## 2015-05-14 DIAGNOSIS — H5203 Hypermetropia, bilateral: Secondary | ICD-10-CM | POA: Diagnosis not present

## 2015-05-14 DIAGNOSIS — H52223 Regular astigmatism, bilateral: Secondary | ICD-10-CM | POA: Diagnosis not present

## 2015-05-14 MED FILL — LOSARTAN-HCTZ 50-12.5 MG TA: 50-12.5 | 90 days supply | Qty: 90 | Fill #1

## 2015-06-21 ENCOUNTER — Encounter: Payer: Self-pay | Admitting: Family Medicine

## 2015-06-21 DIAGNOSIS — L219 Seborrheic dermatitis, unspecified: Secondary | ICD-10-CM

## 2015-06-21 NOTE — Telephone Encounter (Signed)
Referral to dermatologist ordered as per pt's request.

## 2015-06-21 NOTE — Telephone Encounter (Signed)
Please advise. Thanks.  

## 2015-07-17 DIAGNOSIS — L309 Dermatitis, unspecified: Secondary | ICD-10-CM | POA: Diagnosis not present

## 2015-07-17 MED FILL — CICLOPIROX 1% SHAMPOO: 1 | 30 days supply | Qty: 120 | Fill #0

## 2015-07-19 ENCOUNTER — Telehealth: Payer: 59 | Admitting: Physician Assistant

## 2015-07-19 DIAGNOSIS — J00 Acute nasopharyngitis [common cold]: Secondary | ICD-10-CM | POA: Diagnosis not present

## 2015-07-19 DIAGNOSIS — J208 Acute bronchitis due to other specified organisms: Principal | ICD-10-CM

## 2015-07-19 DIAGNOSIS — B9689 Other specified bacterial agents as the cause of diseases classified elsewhere: Secondary | ICD-10-CM

## 2015-07-19 MED ORDER — BENZONATATE 100 MG PO CAPS: 100.0000 mg | ORAL_CAPSULE | Freq: Three times a day (TID) | ORAL | Status: DC | PRN

## 2015-07-19 MED ORDER — DOXYCYCLINE HYCLATE 100 MG PO CAPS: 100.0000 mg | ORAL_CAPSULE | Freq: Two times a day (BID) | ORAL | Status: DC

## 2015-07-19 MED FILL — DOXYCYCLINE HYC 100 MG CAP: 100 | 7 days supply | Qty: 14 | Fill #0

## 2015-07-19 MED FILL — BENZONATATE 100 MG CAPSULE: 100 | 10 days supply | Qty: 30 | Fill #0

## 2015-07-19 NOTE — Progress Notes (Signed)
We are sorry that you are not feeling well.  Here is how we plan to help!  Based on what you have shared with me it looks like you have upper respiratory tract inflammation that has resulted in a significant cough.  Inflammation and infection in the upper respiratory tract is commonly called bronchitis and has four common causes:  Allergies, Viral Infections, Acid Reflux and Bacterial Infections.  Allergies, viruses and acid reflux are treated by controlling symptoms or eliminating the cause. An example might be a cough caused by taking certain blood pressure medications. You stop the cough by changing the medication. Another example might be a cough caused by acid reflux. Controlling the reflux helps control the cough.  Based on your presentation I believe you most likely have A cough due to bacteria.  When patients have a fever and a productive cough with a change in color or increased sputum production, we are concerned about bacterial bronchitis.  If left untreated it can progress to pneumonia.  If your symptoms do not improve with your treatment plan it is important that you contact your provider.   I hve prescribed Doxycycline 100 mg twice a day for 7 days    In addition you may use A non-prescription cough medication called Mucinex DM: take 2 tablets every 12 hours. and A prescription cough medication called Tessalon Perles 100mg. You may take 1-2 capsules every 8 hours as needed for your cough.    HOME CARE . Only take medications as instructed by your medical team. . Complete the entire course of an antibiotic. . Drink plenty of fluids and get plenty of rest. . Avoid close contacts especially the very young and the elderly . Cover your mouth if you cough or cough into your sleeve. . Always remember to wash your hands . A steam or ultrasonic humidifier can help congestion.    GET HELP RIGHT AWAY IF: . You develop worsening fever. . You become short of breath . You cough up blood. . Your  symptoms persist after you have completed your treatment plan MAKE SURE YOU   Understand these instructions.  Will watch your condition.  Will get help right away if you are not doing well or get worse.  Your e-visit answers were reviewed by a board certified advanced clinical practitioner to complete your personal care plan.  Depending on the condition, your plan could have included both over the counter or prescription medications. If there is a problem please reply  once you have received a response from your provider. Your safety is important to us.  If you have drug allergies check your prescription carefully.    You can use MyChart to ask questions about today's visit, request a non-urgent call back, or ask for a work or school excuse for 24 hours related to this e-Visit. If it has been greater than 24 hours you will need to follow up with your provider, or enter a new e-Visit to address those concerns. You will get an e-mail in the next two days asking about your experience.  I hope that your e-visit has been valuable and will speed your recovery. Thank you for using e-visits.   

## 2015-09-10 MED FILL — LOSARTAN-HCTZ 50-12.5 MG TA: 50-12.5 | 90 days supply | Qty: 90 | Fill #2

## 2015-09-12 MED FILL — CICLOPIROX 1% SHAMPOO: 1 | 30 days supply | Qty: 120 | Fill #1

## 2015-09-17 ENCOUNTER — Encounter: Payer: Self-pay | Admitting: Family Medicine

## 2015-11-02 ENCOUNTER — Encounter (HOSPITAL_COMMUNITY): Payer: Self-pay

## 2015-11-09 MED FILL — CICLOPIROX 1% SHAMPOO: 1 | 30 days supply | Qty: 120 | Fill #2

## 2015-11-12 ENCOUNTER — Ambulatory Visit (INDEPENDENT_AMBULATORY_CARE_PROVIDER_SITE_OTHER): Payer: 59 | Admitting: Family Medicine

## 2015-11-12 ENCOUNTER — Encounter: Payer: Self-pay | Admitting: Family Medicine

## 2015-11-12 VITALS — BP 148/96 | HR 78 | Temp 97.9°F | Resp 18 | Wt 381.4 lb

## 2015-11-12 DIAGNOSIS — L219 Seborrheic dermatitis, unspecified: Secondary | ICD-10-CM

## 2015-11-12 DIAGNOSIS — Z23 Encounter for immunization: Secondary | ICD-10-CM | POA: Diagnosis not present

## 2015-11-12 NOTE — Progress Notes (Signed)
OFFICE VISIT  11/12/2015   CC:  Chief Complaint  Patient presents with  . Follow-up    skin check   HPI:    Patient is a 40 y.o. African-American male who presents for f/u seborrheic dermatitis. Still with multiple lesions on scalp and face, responding only partially to the ciclopirox shampoo, tridesilon cream, and clotrimazole cream.  He has f/u with skin MD again soon.  Of note, he has not taken his bp med yet today. Asks for flu vaccine today.   Past Medical History:  Diagnosis Date  . Hypertension   . Insulin resistance   . Morbid obesity (HCC)   . Obstructive sleep apnea    CPAP 7 cm H2O; most recent sleep study and titration  was 02/2010 (Dr. Maple HudsonYoung).  . [facility]itis 2017   [facility]    Past Surgical History:  Procedure Laterality Date  . HERNIA REPAIR  2005   Abdominal wall  . LAPAROSCOPIC GASTRIC BANDING  2012   with Hiatal hernia repair  . TONSILLECTOMY  2001  . VASECTOMY  2001    Outpatient Medications Prior to Visit  Medication Sig Dispense Refill  . losartan-hydrochlorothiazide (HYZAAR) 50-12.5 MG tablet Take 1 tablet by mouth daily. 90 tablet 3  . benzonatate (TESSALON) 100 MG capsule Take 1 capsule (100 mg total) by mouth 3 (three) times daily as needed for cough. (Patient not taking: Reported on 11/12/2015) 30 capsule 0  . doxycycline (VIBRAMYCIN) 100 MG capsule Take 1 capsule (100 mg total) by mouth 2 (two) times daily. (Patient not taking: Reported on 11/12/2015) 14 capsule 0  . ketoconazole (NIZORAL) 2 % cream Apply 1 application topically daily. (Patient not taking: Reported on 11/12/2015) 15 g 3   No facility-administered medications prior to visit.     Allergies  Allergen Reactions  . Erythromycin   . Lotrel [Amlodipine Besy-Benazepril Hcl] Cough    ROS As per HPI  PE: Blood pressure (!) 148/96, pulse 78, temperature 97.9 F (36.6 C), temperature source Oral, resp. rate 18, weight (!) 381 lb 6.4 oz (173 kg),  SpO2 96 %. Gen: Alert, well appearing.  Patient is oriented to person, place, time, and situation. AFFECT: pleasant, lucid thought and speech. SKIN: several small, oval-shaped, pinkish/peach colored macules with greasy/flaky texture: located on scalp and face and neck.  LABS:  none  IMPRESSION AND PLAN:  Seborrheic dermatitis; stable. F/u with dermatologist as planned.  Pt understands that dx of this skin problem will be chronic/ongoing and may require "tweaking" a few of his creams/shampoos as time goes on in order to find what he responds to best.  No changes made today.  Flu vaccine given today.  An After Visit Summary was printed and given to the patient.  FOLLOW UP: Return if symptoms worsen or fail to improve.  Signed:  Santiago BumpersPhil Ammi Hutt, MD           11/12/2015

## 2016-01-14 ENCOUNTER — Other Ambulatory Visit: Payer: Self-pay | Admitting: Family Medicine

## 2016-01-14 MED FILL — CICLOPIROX 1% SHAMPOO: 1 | 30 days supply | Qty: 120 | Fill #3 | Status: TO

## 2016-01-14 MED FILL — LOSARTAN-HCTZ 50-12.5 MG TA: 50-12.5 | 90 days supply | Qty: 90 | Fill #0

## 2016-01-14 NOTE — Telephone Encounter (Signed)
RF request for losartan/hctz LOV: 01/12/15 Next ov: None Last written: 01/12/15 #90 w/ 3RF  Rx sent for #90 w/ 0RF. Pt is due for f/u [facility], needs office visit for more refills.

## 2016-01-14 NOTE — Telephone Encounter (Signed)
Left message for pt to call back  °

## 2016-01-22 NOTE — Telephone Encounter (Signed)
Pt advised and voiced understanding.  He stated that he will call back to schedule an appointment. 

## 2016-02-15 ENCOUNTER — Other Ambulatory Visit (HOSPITAL_COMMUNITY)
Admission: RE | Admit: 2016-02-15 | Discharge: 2016-02-15 | Disposition: A | Discharge status: Home / Self Care | Payer: 59 | Source: Ambulatory Visit | Attending: Family Medicine | Admitting: Family Medicine

## 2016-02-15 ENCOUNTER — Ambulatory Visit (INDEPENDENT_AMBULATORY_CARE_PROVIDER_SITE_OTHER): Payer: 59 | Admitting: Family Medicine

## 2016-02-15 ENCOUNTER — Encounter: Payer: Self-pay | Admitting: Family Medicine

## 2016-02-15 VITALS — BP 130/86 | HR 91 | Temp 98.7°F | Resp 16 | Ht 72.0 in | Wt 383.2 lb

## 2016-02-15 DIAGNOSIS — R7303 Prediabetes: Secondary | ICD-10-CM | POA: Diagnosis not present

## 2016-02-15 DIAGNOSIS — Z113 Encounter for screening for infections with a predominantly sexual mode of transmission: Secondary | ICD-10-CM

## 2016-02-15 DIAGNOSIS — Z0000 Encounter for general adult medical examination without abnormal findings: Secondary | ICD-10-CM | POA: Diagnosis not present

## 2016-02-15 LAB — HEMOGLOBIN A1C: Hgb A1c MFr Bld: 5.9 % (ref 4.6–6.5)

## 2016-02-15 NOTE — Addendum Note (Signed)
Addended by: Eulah PontALBRIGHT, Marlei Glomski M on: 02/15/2016 04:44 PM   Modules accepted: Orders

## 2016-02-15 NOTE — Progress Notes (Signed)
Office Note 02/15/2016  CC:  Chief Complaint  Patient presents with  . Annual Exam    HPI:  Hector Tucker is a 41 y.o. Black male who is here for annual health maintenance exam. Has labs from [facility] from 01/08/16 with him today: CBC, CMET, Lipid panel, and TSH all normal. HbA1c was not done so we'll do this today. These will be scanned into chart.  Eye exam 1 yr ago.  Dental preventative visits UTD.  Home bp monitoring consistently <140/90.  He continues to work on [employer]. He does not consider himself high risk for STD, but requests HIV and "all other STD screening".  No acute complaints.   Past Medical History:  Diagnosis Date  . Hypertension   . Insulin resistance   . Morbid obesity (HCC)   . Obstructive sleep apnea    CPAP 7 cm H2O; most recent sleep study and titration  was 02/2010 (Dr. Maple Hudson).  . Seborrheic dermatitis 2017   [facility]    Past Surgical History:  Procedure Laterality Date  . HERNIA REPAIR  2005   Abdominal wall  . LAPAROSCOPIC GASTRIC BANDING  2012   with Hiatal hernia repair  . TONSILLECTOMY  2001  . VASECTOMY  2001    Family History  Problem Relation Age of Onset  . Hypertension Mother   . Hypertension Father   . Alcohol abuse Maternal Grandmother     died of cirrhosis    Social History   Social History  . Marital status: Married    Spouse name: N/A  . Number of children: N/A  . Years of education: N/A   Occupational History  . Not on file.   Social History Main Topics  . Smoking status: Never Smoker  . Smokeless tobacco: Never Used  . Alcohol use Yes     Comment: 2-3/week  . Drug use: No  . Sexual activity: Not on file   Other Topics Concern  . Not on file   Social History Narrative   Married, 3 children in high school.   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   Orig from [place], relocated to [place] [facility] in 2009.   No tob, occ alcohol, no drugs.   No regular exercise.    Outpatient  Medications Prior to Visit  Medication Sig Dispense Refill  . Ciclopirox 1 % shampoo   11  . losartan-hydrochlorothiazide (HYZAAR) 50-12.5 MG tablet TAKE 1 TABLET BY MOUTH DAILY. 90 tablet 0  . TRIDESILON 0.05 % cream APPLY TO THE AFFECTED AREA AT BEDTIME  11  . benzonatate (TESSALON) 100 MG capsule Take 1 capsule (100 mg total) by mouth 3 (three) times daily as needed for cough. (Patient not taking: Reported on 11/12/2015) 30 capsule 0  . doxycycline (VIBRAMYCIN) 100 MG capsule Take 1 capsule (100 mg total) by mouth 2 (two) times daily. (Patient not taking: Reported on 11/12/2015) 14 capsule 0  . ketoconazole (NIZORAL) 2 % cream Apply 1 application topically daily. (Patient not taking: Reported on 11/12/2015) 15 g 3   No facility-administered medications prior to visit.     Allergies  Allergen Reactions  . Erythromycin   . Lotrel [Amlodipine Besy-Benazepril Hcl] Cough    ROS Review of Systems  Constitutional: Negative for appetite change, chills, fatigue and fever.  HENT: Negative for congestion, dental problem, ear pain and sore throat.   Eyes: Negative for discharge, redness and visual disturbance.  Respiratory: Negative for cough, chest tightness, shortness of breath and wheezing.  Cardiovascular: Negative for chest pain, palpitations and leg swelling.  Gastrointestinal: Negative for abdominal pain, blood in stool, diarrhea, nausea and vomiting.  Genitourinary: Negative for difficulty urinating, dysuria, flank pain, frequency, hematuria and urgency.  Musculoskeletal: Negative for arthralgias, back pain, joint swelling, myalgias and neck stiffness.  Skin: Negative for pallor and rash.  Neurological: Negative for dizziness, speech difficulty, weakness and headaches.  Hematological: Negative for adenopathy. Does not bruise/bleed easily.  Psychiatric/Behavioral: Negative for confusion and sleep disturbance. The patient is not nervous/anxious.     PE; Blood pressure 130/86, pulse  91, temperature 98.7 F (37.1 C), temperature source Oral, resp. rate 16, height 6' (1.829 m), weight (!) 383 lb 4 oz (173.8 kg), SpO2 94 %.  bp recheck 130/86 Gen: Alert, well appearing.  Patient is oriented to person, place, time, and situation. AFFECT: pleasant, lucid thought and speech. ENT: Ears: EACs clear, normal epithelium.  TMs with good light reflex and landmarks bilaterally.  Eyes: no injection, icteris, swelling, or exudate.  EOMI, PERRLA. Nose: no drainage or turbinate edema/swelling.  No injection or focal lesion.  Mouth: lips without lesion/swelling.  Oral mucosa pink and moist.  Dentition intact and without obvious caries or gingival swelling.  Oropharynx without erythema, exudate, or swelling.  Neck: supple/nontender.  No LAD, mass, or TM.  Carotid pulses 2+ bilaterally, without bruits. CV: RRR, no m/r/g.   LUNGS: CTA bilat, nonlabored resps, good aeration in all lung fields. ABD: soft, NT, ND, BS normal.  No hepatospenomegaly or mass.  No bruits. EXT: no clubbing, cyanosis, or edema.  Musculoskeletal: no joint swelling, erythema, warmth, or tenderness.  ROM of all joints intact. Skin - no sores or suspicious lesions or rashes or color changes  Pertinent labs:  Lab Results  Component Value Date   TSH 1.24 09/15/2013   Lab Results  Component Value Date   WBC 11.8 (H) 02/06/2014   HGB 13.1 02/06/2014   HCT 40.9 02/06/2014   MCV 82.0 02/06/2014   PLT 244 02/06/2014   Lab Results  Component Value Date   CREATININE 0.95 01/12/2015   BUN 17 01/12/2015   NA 140 01/12/2015   K 4.3 01/12/2015   CL 105 01/12/2015   CO2 29 01/12/2015   Lab Results  Component Value Date   ALT 26 01/12/2015   AST 18 01/12/2015   ALKPHOS 69 01/12/2015   BILITOT 0.5 01/12/2015   Lab Results  Component Value Date   CHOL 172 01/12/2015   Lab Results  Component Value Date   HDL 62.00 01/12/2015   Lab Results  Component Value Date   LDLCALC 97 01/12/2015   Lab Results  Component  Value Date   TRIG 66.0 01/12/2015   Lab Results  Component Value Date   CHOLHDL 3 01/12/2015   Lab Results  Component Value Date   HGBA1C 6.0 01/12/2015    ASSESSMENT AND PLAN:   Health maintenance exam: Reviewed age and gender appropriate health maintenance issues (prudent diet, regular exercise, health risks of tobacco and excessive alcohol, use of seatbelts, fire alarms in home, use of sunscreen).  Also reviewed age and gender appropriate health screening as well as vaccine recommendations. Tdap possibly due: pt wants to check his records at home so we did not give this vaccine today. Pt requested screening STD testing so I ordered HIV, RPR, and urine GC/Chlamydia screening today. Also, recent HP labs 12/2015 were excellent and will be scanned into chart.  Hba1c was done today for his hx of prediabetes. Continue  TLC.  An After Visit Summary was printed and given to the patient.  FOLLOW UP:  Return in about 6 months (around 08/14/2016) for routine chronic illness f/u.  Signed:  Santiago Bumpers, MD           02/15/2016

## 2016-02-15 NOTE — Patient Instructions (Signed)

## 2016-02-15 NOTE — Progress Notes (Signed)
Pre visit review using our clinic review tool, if applicable. No additional management support is needed unless otherwise documented below in the visit note. 

## 2016-02-16 LAB — RPR

## 2016-02-16 LAB — HIV ANTIBODY (ROUTINE TESTING W REFLEX): HIV: NONREACTIVE

## 2016-02-18 LAB — URINE CYTOLOGY ANCILLARY ONLY
CHLAMYDIA, DNA PROBE: NEGATIVE
NEISSERIA GONORRHEA: NEGATIVE

## 2016-04-23 ENCOUNTER — Other Ambulatory Visit: Payer: Self-pay | Admitting: *Deleted

## 2016-04-23 MED ORDER — LOSARTAN POTASSIUM-HCTZ 50-12.5 MG PO TABS: 1.0000 | ORAL_TABLET | Freq: Every day | ORAL | 1 refills | Status: DC

## 2016-04-23 NOTE — Telephone Encounter (Signed)
[facility] [place] [place]  RF request for losartan/hctz LOV: 02/15/16 Next ov: 05/01/16 Last written: 01/14/16 #90 w/ 0RF

## 2016-04-27 DIAGNOSIS — N529 Male erectile dysfunction, unspecified: Secondary | ICD-10-CM

## 2016-04-27 HISTORY — DX: Male erectile dysfunction, unspecified: N52.9

## 2016-05-01 ENCOUNTER — Encounter: Payer: Self-pay | Admitting: Family Medicine

## 2016-05-01 ENCOUNTER — Ambulatory Visit (INDEPENDENT_AMBULATORY_CARE_PROVIDER_SITE_OTHER): Payer: BLUE CROSS/BLUE SHIELD | Admitting: Family Medicine

## 2016-05-01 VITALS — BP 144/83 | HR 71 | Temp 98.4°F | Resp 18 | Wt 380.0 lb

## 2016-05-01 DIAGNOSIS — N5201 Erectile dysfunction due to arterial insufficiency: Secondary | ICD-10-CM | POA: Diagnosis not present

## 2016-05-01 MED ORDER — SILDENAFIL CITRATE 20 MG PO TABS: ORAL_TABLET | ORAL | 3 refills | Status: DC

## 2016-05-01 NOTE — Progress Notes (Signed)
OFFICE VISIT  05/01/2016   CC:  Chief Complaint  Patient presents with  . Erectile Dysfunction   HPI:    Patient is a 41 y.o.  male who presents for discussion of "ED". Began having this problem about 6 wks ago. Erection is soft, able to penetrate for intercourse but then it gets limp after 1 min. Says libido is very much intact.   Some significant stressors this year: job change, some issues with daughter, getting ready to relocate to [place]   No ED problems in the past with times of excessive stress.  No pain or abnormal curvature of penis.   Past Medical History:  Diagnosis Date  . Hypertension   . Insulin resistance   . Morbid obesity (HCC)   . Obstructive sleep apnea    CPAP 7 cm H2O; most recent sleep study and titration  was 02/2010 (Dr. Maple Hudson).  . Seborrheic dermatitis 2017   [facility]    Past Surgical History:  Procedure Laterality Date  . HERNIA REPAIR  2005   Abdominal wall  . LAPAROSCOPIC GASTRIC BANDING  2012   with Hiatal hernia repair  . TONSILLECTOMY  2001  . VASECTOMY  2001    Outpatient Medications Prior to Visit  Medication Sig Dispense Refill  . Ciclopirox 1 % shampoo   11  . losartan-hydrochlorothiazide (HYZAAR) 50-12.5 MG tablet Take 1 tablet by mouth daily. 90 tablet 1  . TRIDESILON 0.05 % cream APPLY TO THE AFFECTED AREA AT BEDTIME  11   No facility-administered medications prior to visit.     Allergies  Allergen Reactions  . Erythromycin   . Lotrel [Amlodipine Besy-Benazepril Hcl] Cough    ROS As per HPI  PE: Blood pressure (!) 144/83, pulse 71, temperature 98.4 F (36.9 C), temperature source Oral, resp. rate 18, weight (!) 380 lb (172.4 kg), SpO2 97 %. Gen: Alert, well appearing.  Patient is oriented to person, place, time, and situation. AFFECT: pleasant, lucid thought and speech. No further exam today.  LABS:  none  IMPRESSION AND PLAN:  Erectile dysfunction. Will do trial of generic viagra   tabs, 1-5 tabs po qd prn, #50, RF x 3. If this rx does not work out with his insurer then pt will call back with info about which ED med is preferred by his insurer. Therapeutic expectations and side effect profile of medication discussed today.  Patient's questions answered.  An After Visit Summary was printed and given to the patient.  FOLLOW UP: Return follow up as needed.  Signed:  Santiago Bumpers, MD           05/01/2016

## 2016-10-19 ENCOUNTER — Other Ambulatory Visit: Payer: Self-pay | Admitting: Family Medicine

## 2016-12-16 ENCOUNTER — Encounter (HOSPITAL_COMMUNITY): Payer: Self-pay

## 2016-12-28 ENCOUNTER — Encounter: Payer: Self-pay | Admitting: Family Medicine

## 2017-01-15 ENCOUNTER — Other Ambulatory Visit: Payer: Self-pay | Admitting: Family Medicine

## 2017-01-15 ENCOUNTER — Encounter: Payer: Self-pay | Admitting: Family Medicine

## 2017-01-15 ENCOUNTER — Ambulatory Visit: Payer: BLUE CROSS/BLUE SHIELD | Admitting: Family Medicine

## 2017-01-15 VITALS — BP 146/90 | HR 68 | Resp 16 | Wt 384.0 lb

## 2017-01-15 DIAGNOSIS — I10 Essential (primary) hypertension: Secondary | ICD-10-CM

## 2017-01-15 DIAGNOSIS — G453 Amaurosis fugax: Secondary | ICD-10-CM | POA: Diagnosis not present

## 2017-01-15 DIAGNOSIS — H538 Other visual disturbances: Secondary | ICD-10-CM

## 2017-01-15 DIAGNOSIS — R7301 Impaired fasting glucose: Secondary | ICD-10-CM | POA: Diagnosis not present

## 2017-01-15 LAB — POCT GLYCOSYLATED HEMOGLOBIN (HGB A1C): HEMOGLOBIN A1C: 5.9

## 2017-01-15 MED ORDER — LOSARTAN POTASSIUM-HCTZ 100-12.5 MG PO TABS: 1.0000 | ORAL_TABLET | Freq: Every day | ORAL | 1 refills | Status: DC

## 2017-01-15 NOTE — Progress Notes (Signed)
OFFICE VISIT  01/15/2017   CC:  Chief Complaint  Patient presents with  . Hypertension    follow up blood pressure, discuss vision blurriness in right eye x 1 day     HPI:    Patient is a 41 y.o.  male who presents for f/u HTN. Has IFG, morb obesity. Reviewed recent (11/2016) wellness labs through is employer: normal CMET, CBC, TSH, PSA, Lipids. No HbA1c was done.  BP was 144/86 at that time.  HOME bp's: 140s/80s  About 2 wks ago he had brief R eye blurriness that impaired his vision that lasted hours. Was back to normal when he woke up the next day. Has not recurred since. No HAs, no dizziness, no CP, no SOB.   No f/c/malaise.  NO neck stiffness.  Past Medical History:  Diagnosis Date  . Erectile dysfunction 04/2016  . Hypertension   . Insulin resistance   . Morbid obesity (HCC)   . Obstructive sleep apnea    CPAP 7 cm H2O; most recent sleep study and titration  was 02/2010 (Dr. Maple HudsonYoung).  . Seborrheic dermatitis 2017   [facility]    Past Surgical History:  Procedure Laterality Date  . HERNIA REPAIR  2005   Abdominal wall  . LAPAROSCOPIC GASTRIC BANDING  2012   with Hiatal hernia repair  . TONSILLECTOMY  2001  . VASECTOMY  2001    Outpatient Medications Prior to Visit  Medication Sig Dispense Refill  . Ciclopirox 1 % shampoo   11  . sildenafil (REVATIO) 20 MG tablet 1-5 tabs po qd prn.  Take 30 minutes prior to intercourse. 50 tablet 3  . losartan-hydrochlorothiazide (HYZAAR) 50-12.5 MG tablet TAKE 1 TABLET BY MOUTH DAILY. 90 tablet 0  . doxycycline (VIBRAMYCIN) 100 MG capsule   0  . fluconazole (DIFLUCAN) 200 MG tablet   0  . TRIDESILON 0.05 % cream APPLY TO THE AFFECTED AREA AT BEDTIME  11   No facility-administered medications prior to visit.     Allergies  Allergen Reactions  . Erythromycin   . Lotrel [Amlodipine Besy-Benazepril Hcl] Cough    ROS As per HPI  PE: Blood pressure (!) 146/90, pulse 68, resp. rate 16, weight (!)  384 lb (174.2 kg), SpO2 97 %. Gen: Alert, well appearing.  Patient is oriented to person, place, time, and situation. AFFECT: pleasant, lucid thought and speech. AVW:UJWJENT:Eyes: no injection, icteris, swelling, or exudate.  EOMI, PERRLA.  RR's symmetric.  No cataracts. CV: RRR, no m/r/g.   LUNGS: CTA bilat, nonlabored resps, good aeration in all lung fields. EXT: no clubbing, cyanosis, or edema.  Neck: supple/nontender.  No LAD, mass, or TM.  Carotid pulses 2+ bilaterally, without bruits.    LABS:    Visual Acuity Screening   Right eye Left eye Both eyes  Without correction:     With correction: 20/10 20/20 20/20       Chemistry      Component Value Date/Time   NA 140 01/12/2015 0838   K 4.3 01/12/2015 0838   CL 105 01/12/2015 0838   CO2 29 01/12/2015 0838   BUN 17 01/12/2015 0838   CREATININE 0.95 01/12/2015 0838      Component Value Date/Time   CALCIUM 9.1 01/12/2015 0838   ALKPHOS 69 01/12/2015 0838   AST 18 01/12/2015 0838   ALT 26 01/12/2015 0838   BILITOT 0.5 01/12/2015 0838     Lab Results  Component Value Date   CHOL 172 01/12/2015   HDL 62.00  01/12/2015   LDLCALC 97 01/12/2015   TRIG 66.0 01/12/2015   CHOLHDL 3 01/12/2015   Lab Results  Component Value Date   TSH 1.24 09/15/2013   Lab Results  Component Value Date   WBC 11.8 (H) 02/06/2014   HGB 13.1 02/06/2014   HCT 40.9 02/06/2014   MCV 82.0 02/06/2014   PLT 244 02/06/2014   Lab Results  Component Value Date   HGBA1C 5.9 02/15/2016   POC HbA1c today= 5.9%  IMPRESSION AND PLAN:  1) HTN: not ideal control. Increase hyzaar to 100/12.5 qd. Continue home bp monitoring. Recent lytes/cr good 11/2016.  2) Insulin resistance/IFG: stable A1c at 5.9%. Continue efforts at TLC/wt loss. Recent lipids good 11/2016.  3) Amaurosis fugax: one episode. Eye exam and vision screen normal today. Carotid doppler u/s ordered (bilat). Start ASA 81mg  qd.  An After Visit Summary was printed and given to the  patient.  FOLLOW UP: Return in about 4 weeks (around 02/12/2017) for f/u HTN.  Signed:  Santiago BumpersPhil Maxximus Gotay, MD           01/15/2017

## 2017-01-30 ENCOUNTER — Encounter: Payer: Self-pay | Admitting: Family Medicine

## 2017-01-30 ENCOUNTER — Telehealth: Payer: Self-pay | Admitting: Family Medicine

## 2017-01-30 DIAGNOSIS — I10 Essential (primary) hypertension: Secondary | ICD-10-CM

## 2017-01-30 DIAGNOSIS — R7301 Impaired fasting glucose: Secondary | ICD-10-CM

## 2017-01-30 DIAGNOSIS — H538 Other visual disturbances: Secondary | ICD-10-CM

## 2017-01-30 NOTE — Telephone Encounter (Signed)
Please advise. Thanks.  

## 2017-01-30 NOTE — Telephone Encounter (Signed)
You are fine. [facility] has called him & he told them he is going to schedule something closer to home. I'll call him to find out where the order needs to go.

## 2017-01-30 NOTE — Telephone Encounter (Signed)
I entered a carotid u/s under "external" imaging office per pt request.  Pls see if this order is put in the correct way for me (in other words, will it show up on the correct work que?).  I don't want it to "drop through the cracks".-thx

## 2017-02-01 NOTE — Telephone Encounter (Signed)
I entered the details about where he wants to go when I ordered the new referral on 01/30/17.

## 2017-02-02 NOTE — Telephone Encounter (Signed)
Copied from CRM [id number]. Topic: General - Other >> Feb 02, 2017 10:25 AM Lelon FrohlichGolden, Tashia, RMA wrote: Reason for CRM: Pt called and wanted his appointment for [facility] carotid ultrasound [facility] number [phone]

## 2017-02-02 NOTE — Telephone Encounter (Signed)
Left detailed message for patient appt scheduled 02/05/17 4:00 arrive 3:45 [facility], [address], [place]

## 2017-02-11 ENCOUNTER — Other Ambulatory Visit: Payer: Self-pay

## 2017-02-11 MED ORDER — LOSARTAN POTASSIUM-HCTZ 100-12.5 MG PO TABS: 1.0000 | ORAL_TABLET | Freq: Every day | ORAL | 1 refills | Status: DC

## 2017-02-16 ENCOUNTER — Telehealth: Payer: Self-pay | Admitting: Family Medicine

## 2017-02-16 NOTE — Telephone Encounter (Signed)
(  Provider did not send or attach results to result note.)

## 2017-02-16 NOTE — Telephone Encounter (Signed)
pls notify pt that the ultrasound of his carotid arteries came back fine: he has a moderate amount of atherosclerosis in the common carotid arteries on both sides, but his internal and external carotid arteries are completely clear.  Reassure pt.  No further testing needed at this time.-thx

## 2017-02-16 NOTE — Telephone Encounter (Signed)
Left message for pt to call back.   Okay for [facility] to discuss results/PCP recommendations.   

## 2017-02-16 NOTE — Telephone Encounter (Signed)
Patient returned call for results, results given per note Dr. Milinda CaveMcGowen 02/16/17, patient verbalized understanding, unable to document in result note due to no result note [facility].

## 2017-03-16 ENCOUNTER — Other Ambulatory Visit: Payer: Self-pay | Admitting: Family Medicine

## 2017-07-23 ENCOUNTER — Other Ambulatory Visit: Payer: Self-pay | Admitting: Family Medicine

## 2017-11-11 ENCOUNTER — Encounter: Payer: Self-pay | Admitting: Family Medicine

## 2017-12-10 ENCOUNTER — Encounter (HOSPITAL_COMMUNITY): Payer: Self-pay

## 2022-02-20 NOTE — Progress Notes (Signed)
This encounter was created in error - please disregard.
